# Patient Record
Sex: Male | Born: 1995 | Race: White | Hispanic: No | Smoking: Never smoker
Health system: Southern US, Community
[De-identification: ages and names within clinical notes are randomized; demographics above are authoritative.]

## PROBLEM LIST (undated history)

## (undated) DIAGNOSIS — F419 Anxiety disorder, unspecified: Secondary | ICD-10-CM

## (undated) DIAGNOSIS — F329 Major depressive disorder, single episode, unspecified: Secondary | ICD-10-CM

## (undated) DIAGNOSIS — I10 Essential (primary) hypertension: Secondary | ICD-10-CM

## (undated) DIAGNOSIS — F1010 Alcohol abuse, uncomplicated: Secondary | ICD-10-CM

## (undated) DIAGNOSIS — F32A Depression, unspecified: Secondary | ICD-10-CM

## (undated) HISTORY — PX: WISDOM TOOTH EXTRACTION: SHX21

---
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
## 2014-09-22 ENCOUNTER — Encounter (HOSPITAL_COMMUNITY): Payer: Self-pay | Admitting: Emergency Medicine

## 2014-09-22 ENCOUNTER — Encounter (HOSPITAL_COMMUNITY): Payer: Self-pay | Admitting: *Deleted

## 2014-09-22 ENCOUNTER — Emergency Department (HOSPITAL_COMMUNITY)
Admission: EM | Admit: 2014-09-22 | Discharge: 2014-09-22 | Disposition: A | Discharge status: Other Acute Inpatient Hospital | Payer: Self-pay | Attending: Emergency Medicine | Admitting: Emergency Medicine

## 2014-09-22 ENCOUNTER — Inpatient Hospital Stay (HOSPITAL_COMMUNITY)
Admission: EM | Admit: 2014-09-22 | Discharge: 2014-09-24 | DRG: 885 | Disposition: A | Discharge status: Home / Self Care | Payer: 59 | Source: Intra-hospital | Attending: Psychiatry | Admitting: Psychiatry

## 2014-09-22 DIAGNOSIS — F419 Anxiety disorder, unspecified: Secondary | ICD-10-CM | POA: Diagnosis present

## 2014-09-22 DIAGNOSIS — F332 Major depressive disorder, recurrent severe without psychotic features: Principal | ICD-10-CM | POA: Diagnosis present

## 2014-09-22 DIAGNOSIS — R45851 Suicidal ideations: Secondary | ICD-10-CM | POA: Insufficient documentation

## 2014-09-22 DIAGNOSIS — F329 Major depressive disorder, single episode, unspecified: Secondary | ICD-10-CM | POA: Diagnosis present

## 2014-09-22 DIAGNOSIS — G4700 Insomnia, unspecified: Secondary | ICD-10-CM | POA: Diagnosis present

## 2014-09-22 DIAGNOSIS — R4589 Other symptoms and signs involving emotional state: Secondary | ICD-10-CM

## 2014-09-22 DIAGNOSIS — F4323 Adjustment disorder with mixed anxiety and depressed mood: Secondary | ICD-10-CM | POA: Diagnosis present

## 2014-09-22 DIAGNOSIS — R4689 Other symptoms and signs involving appearance and behavior: Secondary | ICD-10-CM

## 2014-09-22 DIAGNOSIS — Z599 Problem related to housing and economic circumstances, unspecified: Secondary | ICD-10-CM | POA: Diagnosis not present

## 2014-09-22 DIAGNOSIS — F331 Major depressive disorder, recurrent, moderate: Secondary | ICD-10-CM | POA: Insufficient documentation

## 2014-09-22 LAB — COMPREHENSIVE METABOLIC PANEL
ALBUMIN: 4.4 g/dL (ref 3.5–5.2)
ALK PHOS: 82 U/L (ref 39–117)
ALT: 15 U/L (ref 0–53)
AST: 13 U/L (ref 0–37)
Anion gap: 10 (ref 5–15)
BILIRUBIN TOTAL: 0.5 mg/dL (ref 0.3–1.2)
BUN: 9 mg/dL (ref 6–23)
CHLORIDE: 103 meq/L (ref 96–112)
CO2: 28 mEq/L (ref 19–32)
Calcium: 9.4 mg/dL (ref 8.4–10.5)
Creatinine, Ser: 0.86 mg/dL (ref 0.50–1.35)
GLUCOSE: 108 mg/dL — AB (ref 70–99)
POTASSIUM: 3.5 meq/L — AB (ref 3.7–5.3)
SODIUM: 141 meq/L (ref 137–147)
Total Protein: 7.4 g/dL (ref 6.0–8.3)

## 2014-09-22 LAB — RAPID URINE DRUG SCREEN, HOSP PERFORMED
Amphetamines: NOT DETECTED
Barbiturates: NOT DETECTED
Benzodiazepines: NOT DETECTED
Cocaine: NOT DETECTED
Opiates: NOT DETECTED
TETRAHYDROCANNABINOL: POSITIVE — AB

## 2014-09-22 LAB — CBC WITH DIFFERENTIAL/PLATELET
BASOS ABS: 0 10*3/uL (ref 0.0–0.1)
Basophils Relative: 0 % (ref 0–1)
EOS ABS: 0.1 10*3/uL (ref 0.0–0.7)
EOS PCT: 2 % (ref 0–5)
HCT: 40.8 % (ref 39.0–52.0)
HEMOGLOBIN: 14.3 g/dL (ref 13.0–17.0)
LYMPHS PCT: 32 % (ref 12–46)
Lymphs Abs: 2.4 10*3/uL (ref 0.7–4.0)
MCH: 31.2 pg (ref 26.0–34.0)
MCHC: 35 g/dL (ref 30.0–36.0)
MCV: 88.9 fL (ref 78.0–100.0)
Monocytes Absolute: 0.8 10*3/uL (ref 0.1–1.0)
Monocytes Relative: 11 % (ref 3–12)
Neutro Abs: 4 10*3/uL (ref 1.7–7.7)
Neutrophils Relative %: 55 % (ref 43–77)
PLATELETS: 257 10*3/uL (ref 150–400)
RBC: 4.59 MIL/uL (ref 4.22–5.81)
RDW: 12.7 % (ref 11.5–15.5)
WBC: 7.4 10*3/uL (ref 4.0–10.5)

## 2014-09-22 LAB — ETHANOL

## 2014-09-22 MED ORDER — ZOLPIDEM TARTRATE 5 MG PO TABS: 5.0000 mg | ORAL_TABLET | Freq: Every evening | ORAL | Status: DC | PRN

## 2014-09-22 MED ORDER — NICOTINE 21 MG/24HR TD PT24
21.0000 mg | MEDICATED_PATCH | Freq: Every day | TRANSDERMAL | Status: DC
Start: 2014-09-22 — End: 2014-09-22

## 2014-09-22 MED ORDER — LORAZEPAM 1 MG PO TABS: 1.0000 mg | ORAL_TABLET | Freq: Three times a day (TID) | ORAL | Status: DC | PRN

## 2014-09-22 MED ORDER — ONDANSETRON HCL 4 MG PO TABS: 4.0000 mg | ORAL_TABLET | Freq: Three times a day (TID) | ORAL | Status: DC | PRN

## 2014-09-22 MED ORDER — ALUM & MAG HYDROXIDE-SIMETH 200-200-20 MG/5ML PO SUSP: 30.0000 mL | ORAL | Status: DC | PRN

## 2014-09-22 MED ORDER — ACETAMINOPHEN 325 MG PO TABS: 650.0000 mg | ORAL_TABLET | Freq: Four times a day (QID) | ORAL | Status: DC | PRN

## 2014-09-22 MED ORDER — MAGNESIUM HYDROXIDE 400 MG/5ML PO SUSP: 30.0000 mL | Freq: Every day | ORAL | Status: DC | PRN

## 2014-09-22 MED ORDER — ACETAMINOPHEN 325 MG PO TABS: 650.0000 mg | ORAL_TABLET | ORAL | Status: DC | PRN

## 2014-09-22 MED ORDER — HYDROXYZINE HCL 25 MG PO TABS: 25.0000 mg | ORAL_TABLET | Freq: Two times a day (BID) | ORAL | Status: DC | PRN

## 2014-09-22 MED ORDER — TRAZODONE HCL 50 MG PO TABS
50.0000 mg | ORAL_TABLET | Freq: Every evening | ORAL | Status: DC | PRN
  Filled 2014-09-22 (×7): qty 1

## 2014-09-22 NOTE — ED Notes (Signed)
Physical assessment of pt, shows no skin breakdown or wounds to body.  [facility] to leave pt in our care with sitter at bedside.

## 2014-09-22 NOTE — ED Provider Notes (Signed)
CSN: [id number]     Arrival date & time 09/22/14  0110 History   First MD Initiated Contact with Patient 09/22/14 0236     Chief Complaint  Patient presents with  . V70.1     (Consider location/radiation/quality/duration/timing/severity/associated sxs/prior Treatment) The history is provided by the patient.  18 year old male was brought here because his family took out involuntary commitment papers on him. Family states that he has been acting strange for the last several months. He had been threatening deeply he was living with. He has placed posts on [employer] stating that he has tried to kill himself several times and "has had close attempts multiple times". He has told family members "you should see what I do to myself". Family member who is here with him states that he has told other people that he has been depressed and thought of killing himself. When I speak with him, patient denies depression and denies homicidal or suicidal thoughts. He denies crying spells, sleep disturbance, anhedonia. He had a post on Facebook stating his time was up and when asked about that he meant that he was going to sleep and that he was going off of Facebook. He denies alcohol or drug use. He denies hallucinations. He denies prior psychiatric diagnosis or treatment.  History reviewed. No pertinent past medical history. History reviewed. No pertinent past surgical history. History reviewed. No pertinent family history. History  Substance Use Topics  . Smoking status: Never Smoker   . Smokeless tobacco: Never Used  . Alcohol Use: No    Review of Systems  All other systems reviewed and are negative.     Allergies  Review of patient's allergies indicates not on file.  Home Medications   Prior to Admission medications   Not on File   BP 145/80 mmHg  Pulse 101  Temp(Src) 99.3 F (37.4 C) (Oral)  Resp 18  Ht 6' (1.829 m)  Wt 153 lb (69.4 kg)  BMI 20.75 kg/m2  SpO2 100% Physical Exam  Nursing  note and vitals reviewed.  18 year old male, resting comfortably and in no acute distress. Vital signs are significant for borderline tachycardia and mild hypertension. Oxygen saturation is 100%, which is normal. Head is normocephalic and atraumatic. PERRLA, EOMI. Oropharynx is clear. Neck is nontender and supple without adenopathy or JVD. Back is nontender and there is no CVA tenderness. Lungs are clear without rales, wheezes, or rhonchi. Chest is nontender. Heart has regular rate and rhythm without murmur. Abdomen is soft, flat, nontender without masses or hepatosplenomegaly and peristalsis is normoactive. Extremities have no cyanosis or edema, full range of motion is present. Skin is warm and dry without rash. No signs of trauma. Neurologic: Mental status is normal, cranial nerves are intact, there are no motor or sensory deficits. Psychiatric: Slightly depressed affect. He tends to speak in a monotone and does not make eye contact.  ED Course  Procedures (including critical care time) Labs Review Results for orders placed or performed during the hospital encounter of 09/22/14  CBC WITH DIFFERENTIAL  Result Value Ref Range   WBC 7.4 4.0 - 10.5 K/uL   RBC 4.59 4.22 - 5.81 MIL/uL   Hemoglobin 14.3 13.0 - 17.0 g/dL   HCT 40.940.8 81.139.0 - 91.452.0 %   MCV 88.9 78.0 - 100.0 fL   MCH 31.2 26.0 - 34.0 pg   MCHC 35.0 30.0 - 36.0 g/dL   RDW 78.212.7 95.611.5 - 21.315.5 %   Platelets 257 150 - 400 K/uL  Neutrophils Relative % 55 43 - 77 %   Neutro Abs 4.0 1.7 - 7.7 K/uL   Lymphocytes Relative 32 12 - 46 %   Lymphs Abs 2.4 0.7 - 4.0 K/uL   Monocytes Relative 11 3 - 12 %   Monocytes Absolute 0.8 0.1 - 1.0 K/uL   Eosinophils Relative 2 0 - 5 %   Eosinophils Absolute 0.1 0.0 - 0.7 K/uL   Basophils Relative 0 0 - 1 %   Basophils Absolute 0.0 0.0 - 0.1 K/uL  Comprehensive metabolic panel  Result Value Ref Range   Sodium 141 137 - 147 mEq/L   Potassium 3.5 (L) 3.7 - 5.3 mEq/L   Chloride 103 96 - 112 mEq/L    CO2 28 19 - 32 mEq/L   Glucose, Bld 108 (H) 70 - 99 mg/dL   BUN 9 6 - 23 mg/dL   Creatinine, Ser 2.950.86 0.50 - 1.35 mg/dL   Calcium 9.4 8.4 - 28.410.5 mg/dL   Total Protein 7.4 6.0 - 8.3 g/dL   Albumin 4.4 3.5 - 5.2 g/dL   AST 13 0 - 37 U/L   ALT 15 0 - 53 U/L   Alkaline Phosphatase 82 39 - 117 U/L   Total Bilirubin 0.5 0.3 - 1.2 mg/dL   GFR calc non Af Amer >90 >90 mL/min   GFR calc Af Amer >90 >90 mL/min   Anion gap 10 5 - 15  Drug screen panel, emergency  Result Value Ref Range   Opiates NONE DETECTED NONE DETECTED   Cocaine NONE DETECTED NONE DETECTED   Benzodiazepines NONE DETECTED NONE DETECTED   Amphetamines NONE DETECTED NONE DETECTED   Tetrahydrocannabinol POSITIVE (A) NONE DETECTED   Barbiturates NONE DETECTED NONE DETECTED  Ethanol  Result Value Ref Range   Alcohol, Ethyl (B) <11 0 - 11 mg/dL   MDM   Final diagnoses:  Suicidal behavior    Possible depression with suicidal thoughts. Patient is denying all of this to me but he certainly has a depressed affect. He also is quick to explain away things such as his expiration for his Facebook post about his time is up. He is strict screen is positive for tetrahydrocannabinol and he says that he was in the bathroom at the park and people were smoking marijuana there and his marijuana would be secondhand. Consultation will be obtained with TTS.  Psychiatry has evaluated the patient 10 feel that he thinks he does have suicidal ideation and [facility].  Dione Boozeavid Aleanna Menge, MD 09/23/14 [phone]

## 2014-09-22 NOTE — ED Notes (Signed)
Per IVC paperwork, pt's father states pt having been acting like himself for pass month, and has posted on Facebook about taking life tonight.  Per pt, that's what his family says, he's not saying anything.

## 2014-09-22 NOTE — ED Notes (Signed)
Pt had tele/assessment and tele/psych this am, physician will contact sister Lawerance BachSabrina Reser, for further questions regarding pt's evaluation.  Contact Sister Lawerance Bach(Sabrina Axe) [phone]

## 2014-09-22 NOTE — Consult Note (Signed)
Telepsych Consultation   Reason for Consult:  Patient disposition Referring Physician:  Roxanne Mins MD Keith Parks is an 18 y.o. male.  Assessment: AXIS I:  Major Depression, Recurrent severe AXIS II:  No diagnosis AXIS III:  History reviewed. No pertinent past medical history. AXIS IV:  problems with primary support group AXIS V:  11-20 some danger of hurting self or others possible OR occasionally fails to maintain minimal personal hygiene OR gross impairment in communication  Plan:  Disposition will be depending further subjective findings and collaboration of IVC information with sister Keith Parks  Subjective:   Keith Parks is a 18 y.o. male patient presenting to the [facility] under IVC Papers filed by his sister Keith Parks. The sister is unavailable at time of patient interview. I attempted to call the patient for collateral information at 05:42 hours without success. Reportedly the patient has been threatening family members, acting strange for several months and has posted statements on Face book that is concerning for suicidality. Keith Parks has reportedly told his peers and friends he's depressed and is having thoughts of killing himself. The patient when asked to rate his depressive sx stated it is a 2/10. The patient is denying any insomnia, change in appetite, weight loss, change in hygiene habits, anhedonia, crying spells, racing thoughts or mood swings. The patient is denying nay mental health history, use of MH out patient services, or pending legal concenrs. The patient is denying use of tobacco, alcohol and illicit drugs albeit his UDS is positive for THC. The patient is a HS grad, currently unemployed and without stated hobbies or outside interest. The patient has a benign PMHX and isnt under the care of a PCP or Psychiatrist/Therapist. The patient patient is denying nay hx of PTSD, sexual and or physical abuses.   HPI Elements: Location: MDD without psychosis Quality:  Acute Severity: Severe Timing: Unknown Duration: Unknown Context: making of Suicidal comments and posting on FB page        Past Psychiatric History: History reviewed. No pertinent past medical history.  reports that he has never smoked. He has never used smokeless tobacco. He reports that he does not drink alcohol or use illicit drugs. History reviewed. No pertinent family history.       Allergies:  Not on File  ACT Assessment Complete:  No:   Past Psychiatric History: Diagnosis:  MDD with reported SI  Hospitalizations:  no  Outpatient Care:  no  Substance Abuse Care:  no  Self-Mutilation:  no  Suicidal Attempts:  unknown  Homicidal Behaviors:  no   Violent Behaviors:  no   Place of Residence:  [place] Marital Status:  single Employed/Unemployed:  unemployed Education:  HS grad Family Supports:  yes Objective: Blood pressure 100/52, pulse 59, temperature 97.8 F (36.6 C), temperature source Oral, resp. rate 18, height 6' (1.829 m), weight 69.4 kg (153 lb), SpO2 98 %.Body mass index is 20.75 kg/(m^2). Results for orders placed or performed during the hospital encounter of 09/22/14 (from the past 72 hour(s))  CBC WITH DIFFERENTIAL     Status: None   Collection Time: 09/22/14  1:52 AM  Result Value Ref Range   WBC 7.4 4.0 - 10.5 K/uL   RBC 4.59 4.22 - 5.81 MIL/uL   Hemoglobin 14.3 13.0 - 17.0 g/dL   HCT 40.8 39.0 - 52.0 %   MCV 88.9 78.0 - 100.0 fL   MCH 31.2 26.0 - 34.0 pg   MCHC 35.0 30.0 - 36.0 g/dL  RDW 12.7 11.5 - 15.5 %   Platelets 257 150 - 400 K/uL   Neutrophils Relative % 55 43 - 77 %   Neutro Abs 4.0 1.7 - 7.7 K/uL   Lymphocytes Relative 32 12 - 46 %   Lymphs Abs 2.4 0.7 - 4.0 K/uL   Monocytes Relative 11 3 - 12 %   Monocytes Absolute 0.8 0.1 - 1.0 K/uL   Eosinophils Relative 2 0 - 5 %   Eosinophils Absolute 0.1 0.0 - 0.7 K/uL   Basophils Relative 0 0 - 1 %   Basophils Absolute 0.0 0.0 - 0.1 K/uL  Comprehensive metabolic panel     Status:  Abnormal   Collection Time: 09/22/14  1:52 AM  Result Value Ref Range   Sodium 141 137 - 147 mEq/L   Potassium 3.5 (L) 3.7 - 5.3 mEq/L   Chloride 103 96 - 112 mEq/L   CO2 28 19 - 32 mEq/L   Glucose, Bld 108 (H) 70 - 99 mg/dL   BUN 9 6 - 23 mg/dL   Creatinine, Ser 0.86 0.50 - 1.35 mg/dL   Calcium 9.4 8.4 - 10.5 mg/dL   Total Protein 7.4 6.0 - 8.3 g/dL   Albumin 4.4 3.5 - 5.2 g/dL   AST 13 0 - 37 U/L   ALT 15 0 - 53 U/L   Alkaline Phosphatase 82 39 - 117 U/L   Total Bilirubin 0.5 0.3 - 1.2 mg/dL   GFR calc non Af Amer >90 >90 mL/min   GFR calc Af Amer >90 >90 mL/min    Comment: (NOTE) The eGFR has been calculated using the CKD EPI equation. This calculation has not been validated in all clinical situations. eGFR's persistently <90 mL/min signify possible Chronic Kidney Disease.    Anion gap 10 5 - 15  Ethanol     Status: None   Collection Time: 09/22/14  1:52 AM  Result Value Ref Range   Alcohol, Ethyl (B) <11 0 - 11 mg/dL    Comment:        LOWEST DETECTABLE LIMIT FOR SERUM ALCOHOL IS 11 mg/dL FOR MEDICAL PURPOSES ONLY   Drug screen panel, emergency     Status: Abnormal   Collection Time: 09/22/14  1:55 AM  Result Value Ref Range   Opiates NONE DETECTED NONE DETECTED   Cocaine NONE DETECTED NONE DETECTED   Benzodiazepines NONE DETECTED NONE DETECTED   Amphetamines NONE DETECTED NONE DETECTED   Tetrahydrocannabinol POSITIVE (A) NONE DETECTED   Barbiturates NONE DETECTED NONE DETECTED    Comment:        DRUG SCREEN FOR MEDICAL PURPOSES ONLY.  IF CONFIRMATION IS NEEDED FOR ANY PURPOSE, NOTIFY LAB WITHIN 5 DAYS.        LOWEST DETECTABLE LIMITS FOR URINE DRUG SCREEN Drug Class       Cutoff (ng/mL) Amphetamine      1000 Barbiturate      200 Benzodiazepine   947 Tricyclics       654 Opiates          300 Cocaine          300 THC              50    Labs are reviewed and are pertinent for positive THC per UDS  No current facility-administered medications for this  encounter.   No current outpatient prescriptions on file.    Psychiatric Specialty Exam:     Blood pressure 100/52, pulse 59, temperature 97.8 F (36.6 C),  temperature source Oral, resp. rate 18, height 6' (1.829 m), weight 69.4 kg (153 lb), SpO2 98 %.Body mass index is 20.75 kg/(m^2).  General Appearance: Casual  Eye Contact::  Minimal  Speech:  Slow  Volume:  Decreased  Mood:  Depressed  Affect:  Congruent  Thought Process:  Negative  Orientation:  Full (Time, Place, and Person)  Thought Content:  Negative  Suicidal Thoughts:  Yes.  with intent/plan  Homicidal Thoughts:  No  Memory:  Immediate;   Poor  Judgement:  NA  Insight:  Lacking  Psychomotor Activity:  Negative  Concentration:  Poor  Recall:  Poor  Akathisia:  Negative  Handed:  Right  AIMS (if indicated):     Assets:  Social support  Sleep:      Treatment Plan Summary: Possible IP placement for crises mgmt, safety and or stabilization, after the IVC filent Keith Parks can be interviewed to collaborate what has been reported.  Disposition:    Keith Parks,Keith Parks 09/22/2014 5:49 AM I agree with assessment and plan Geralyn Flash A. Sabra Heck, M.D.

## 2014-09-22 NOTE — ED Provider Notes (Signed)
18 y.o. Male presents to ed with reports of suicidal ideation specifically with report that he has been posting on Facebook about taking his own life.  Psychiatry has been consulted and interviewed patient and family.  IVC papers signed by me.  Psychiatry plans admission but is looking for placement.    Hilario Quarryanielle S Quinetta Shilling, MD 09/22/14 [id number]

## 2014-09-22 NOTE — Consult Note (Signed)
Telepsych Consultation   Reason for Consult:  Suicidal Ideation per IVC papers Referring Physician:  EDP Keith Parks is an 18 y.o. male.  Assessment: AXIS I:  Major Depression, Recurrent severe AXIS II:  Deferred AXIS III:  History reviewed. No pertinent past medical history. AXIS IV:  problems with primary support group AXIS V:  11-20 some danger of hurting self or others possible OR occasionally fails to maintain minimal personal hygiene OR gross impairment in communication  Plan:  Admit to inpatient for stabilization of suicidal ideation and MDD  Subjective:   Keith Parks is a 18 y.o. male patient presenting to the [place] under IVC Papers filed by his sister Bryten Maher. Pt currently minimizes any suicidal ideation or thoughts of depression although presents with flat affect. Pt also denies HI, and AVH. Pt denies any recent triggering events and reports that this is a "misunderstanding from a facebook post about nearing his end of time," which he states was a reference to being near his bedtime. Pt denies ETOH and illicit drug use, aside from a "contact high for weed". Denies any present or past psych history. However, this NP was able to reach the pt's sister, Gabriel Cirri, and father, Shanon Brow for collateral information. The pt's sister states that pt has made multiple Facebook posts about failed suicide attempts (at least 3 per her report) and that he will "have another chance to do it right this time". The sister was extremely concerned about the pt, stating that he has been very depressed and isolating himself x 3 months and that he often will sleep in his car or not return for many days at a time, which no calls, refusal to answer his phone, and no information given to family about his whereabouts. She also states that she read his facebook post about "being near the end of his time" and was very concerned given his previous posts, which she states she took screen shots of, printed and  presented to the magistrate to take out the IVC paperwork. When speaking to his father, Shanon Brow, he reports that multiple family members have called him, including a cousin close to him, stating that "Legrand Como is talking about wanting to hurt himself and says he has cut himself a in the past but it didn't work". His father reports that he doesn't share detailed information with him, but that when the pt is home, he has to "force him to eat because he just won't eat anything at all, like he's starving himself so we have to literally make him eat". Pt's father is very concerned for his safety as well.    HPI Elements: Location: MDD without psychosis Quality: Acute Severity: Severe Timing: Unknown Duration: Unknown Context: making of Suicidal comments and posting on FB page      Past Psychiatric History: History reviewed. No pertinent past medical history.  reports that he has never smoked. He has never used smokeless tobacco. He reports that he does not drink alcohol or use illicit drugs. History reviewed. No pertinent family history.       Allergies:  Not on File  ACT Assessment Complete:  No:   Past Psychiatric History: Diagnosis:  MDD with reported SI  Hospitalizations:  no  Outpatient Care:  no  Substance Abuse Care:  no  Self-Mutilation:  no  Suicidal Attempts:  Unknown, although collateral affirms 3x potentially  Homicidal Behaviors:  no   Violent Behaviors:  no   Place of Residence:  [place] Marital Status:  single Employed/Unemployed:  unemployed Education:  HS grad Family Supports:  yes Objective: Blood pressure 100/52, pulse 59, temperature 97.8 F (36.6 C), temperature source Oral, resp. rate 18, height 6' (1.829 m), weight 69.4 kg (153 lb), SpO2 98 %.Body mass index is 20.75 kg/(m^2). Results for orders placed or performed during the hospital encounter of 09/22/14 (from the past 72 hour(s))  CBC WITH DIFFERENTIAL     Status: None   Collection Time: 09/22/14  1:52 AM   Result Value Ref Range   WBC 7.4 4.0 - 10.5 K/uL   RBC 4.59 4.22 - 5.81 MIL/uL   Hemoglobin 14.3 13.0 - 17.0 g/dL   HCT 40.8 39.0 - 52.0 %   MCV 88.9 78.0 - 100.0 fL   MCH 31.2 26.0 - 34.0 pg   MCHC 35.0 30.0 - 36.0 g/dL   RDW 12.7 11.5 - 15.5 %   Platelets 257 150 - 400 K/uL   Neutrophils Relative % 55 43 - 77 %   Neutro Abs 4.0 1.7 - 7.7 K/uL   Lymphocytes Relative 32 12 - 46 %   Lymphs Abs 2.4 0.7 - 4.0 K/uL   Monocytes Relative 11 3 - 12 %   Monocytes Absolute 0.8 0.1 - 1.0 K/uL   Eosinophils Relative 2 0 - 5 %   Eosinophils Absolute 0.1 0.0 - 0.7 K/uL   Basophils Relative 0 0 - 1 %   Basophils Absolute 0.0 0.0 - 0.1 K/uL  Comprehensive metabolic panel     Status: Abnormal   Collection Time: 09/22/14  1:52 AM  Result Value Ref Range   Sodium 141 137 - 147 mEq/L   Potassium 3.5 (L) 3.7 - 5.3 mEq/L   Chloride 103 96 - 112 mEq/L   CO2 28 19 - 32 mEq/L   Glucose, Bld 108 (H) 70 - 99 mg/dL   BUN 9 6 - 23 mg/dL   Creatinine, Ser 0.86 0.50 - 1.35 mg/dL   Calcium 9.4 8.4 - 10.5 mg/dL   Total Protein 7.4 6.0 - 8.3 g/dL   Albumin 4.4 3.5 - 5.2 g/dL   AST 13 0 - 37 U/L   ALT 15 0 - 53 U/L   Alkaline Phosphatase 82 39 - 117 U/L   Total Bilirubin 0.5 0.3 - 1.2 mg/dL   GFR calc non Af Amer >90 >90 mL/min   GFR calc Af Amer >90 >90 mL/min    Comment: (NOTE) The eGFR has been calculated using the CKD EPI equation. This calculation has not been validated in all clinical situations. eGFR's persistently <90 mL/min signify possible Chronic Kidney Disease.    Anion gap 10 5 - 15  Ethanol     Status: None   Collection Time: 09/22/14  1:52 AM  Result Value Ref Range   Alcohol, Ethyl (B) <11 0 - 11 mg/dL    Comment:        LOWEST DETECTABLE LIMIT FOR SERUM ALCOHOL IS 11 mg/dL FOR MEDICAL PURPOSES ONLY   Drug screen panel, emergency     Status: Abnormal   Collection Time: 09/22/14  1:55 AM  Result Value Ref Range   Opiates NONE DETECTED NONE DETECTED   Cocaine NONE DETECTED  NONE DETECTED   Benzodiazepines NONE DETECTED NONE DETECTED   Amphetamines NONE DETECTED NONE DETECTED   Tetrahydrocannabinol POSITIVE (A) NONE DETECTED   Barbiturates NONE DETECTED NONE DETECTED    Comment:        DRUG SCREEN FOR MEDICAL PURPOSES ONLY.  IF CONFIRMATION IS NEEDED FOR  ANY PURPOSE, NOTIFY LAB WITHIN 5 DAYS.        LOWEST DETECTABLE LIMITS FOR URINE DRUG SCREEN Drug Class       Cutoff (ng/mL) Amphetamine      1000 Barbiturate      200 Benzodiazepine   017 Tricyclics       209 Opiates          300 Cocaine          300 THC              50    Labs are reviewed and are pertinent for positive THC per UDS  No current facility-administered medications for this encounter.   No current outpatient prescriptions on file.    Psychiatric Specialty Exam:     Blood pressure 100/52, pulse 59, temperature 97.8 F (36.6 C), temperature source Oral, resp. rate 18, height 6' (1.829 m), weight 69.4 kg (153 lb), SpO2 98 %.Body mass index is 20.75 kg/(m^2).  General Appearance: Casual  Eye Contact::  Minimal  Speech:  Slow  Volume:  Decreased  Mood:  Depressed  Affect:  Congruent  Thought Process:  Negative  Orientation:  Full (Time, Place, and Person)  Thought Content:  Negative  Suicidal Thoughts:  Yes.  with intent/plan although minimizing  Homicidal Thoughts:  No  Memory:  Immediate;   Poor  Judgement:  NA  Insight:  Lacking  Psychomotor Activity:  Decreased  Concentration:  Poor  Recall:  Poor  Akathisia:  Negative  Handed:  Right  AIMS (if indicated):     Assets:  Social support  Sleep:      Treatment Plan Summary: -Inpatient psychiatric hospitalization for stabilization of suicidal ideation and MDD; [facility] to [facility] if bed available. If not, refer externally.   Disposition: Admit to inpatient.     *Case reviewed with Dr. Horald Pollen, Elyse Jarvis, FNP-BC 09/22/2014 8:55 AM

## 2014-09-22 NOTE — Progress Notes (Signed)
Patient ID: Keith Parks, male   DOB: 01/29/1996, 18 y.o.   MRN: [id number] Client reports he is here because "my sister went to the judge and told a lie, it was all a misunderstanding. My sister read something wrong on Facebook, "I said my time today is ended" Client denies that this was a suicidal ideation.  "my sister also told them something about me threatening people in [place] and that's not true either" Client denies AVH. 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 Client is visibly anxious during the admission, slow to respond to questions. This is his first inpatient treatment, he had no significant medical history.  Client signed treatment agreement. Staff offered food/drink, oriented to unit/room. Staff to monitor q8515min for safety. Client is safe on unit.

## 2014-09-22 NOTE — Tx Team (Signed)
Initial Interdisciplinary Treatment Plan   PATIENT STRESSORS: Educational concerns Marital or family conflict   PROBLEM LIST: Problem List/Patient Goals Date to be addressed Date deferred Reason deferred Estimated date of resolution  Posted on face book "my time for today is ended" "my sister misunderstood it and had me sent here" 09-22-14                                                      DISCHARGE CRITERIA:  Improved stabilization in mood, thinking, and/or behavior Reduction of life-threatening or endangering symptoms to within safe limits Verbal commitment to aftercare and medication compliance  PRELIMINARY DISCHARGE PLAN: Participate in family therapy Return to previous living arrangement  PATIENT/FAMIILY INVOLVEMENT: This treatment plan has been presented to and reviewed with the patient, Keith Parks, and/or family member.  The patient and family have been given the opportunity to ask questions and make suggestions.  [name], Jhana Giarratano N 09/22/2014, 10:52 PM

## 2014-09-22 NOTE — BH Assessment (Signed)
Calls placed to inform patient's attending, now Dr Aileen PilotZammitt, at 4:14PM and nurse, Delila PereyraGinger Pruitt, RN at 4:19 [facility] Bed 2. RN will ensure [facility] before transfer.  Carney Bernatherine C Harrill, LCSW

## 2014-09-23 ENCOUNTER — Encounter (HOSPITAL_COMMUNITY): Payer: Self-pay | Admitting: Psychiatry

## 2014-09-23 DIAGNOSIS — F4325 Adjustment disorder with mixed disturbance of emotions and conduct: Secondary | ICD-10-CM

## 2014-09-23 MED ORDER — NICOTINE POLACRILEX 2 MG MT GUM
2.0000 mg | CHEWING_GUM | OROMUCOSAL | Status: DC | PRN
  Filled 2014-09-23: qty 1

## 2014-09-23 MED ORDER — NICOTINE POLACRILEX 2 MG MT GUM
CHEWING_GUM | OROMUCOSAL | Status: AC
  Filled 2014-09-23: qty 1

## 2014-09-23 NOTE — BHH Counselor (Signed)
Adult Comprehensive Assessment  Patient ID: Keith Parks, male   DOB: 1996/09/07, 18 y.o.   MRN: [id number]  Information Source: Information source: Patient  Current Stressors:  Educational / Learning stressors: None Employment / Job issues: Patient has been unemployed since July 2015 Family Relationships: Patient denies Surveyor, quantityinancial / Lack of resources (include bankruptcy): Makes it okay Housing / Lack of housing: None Physical health (include injuries & life threatening diseases): None Social relationships: None Substance abuse: NOne Bereavement / Loss: None  Living/Environment/Situation:  Living Arrangements: Parent Living conditions (as described by patient or guardian): Good How long has patient lived in current situation?: All of his life What is atmosphere in current home: Comfortable, Supportive  Family History:  Marital status: Single Does patient have children?: No  Childhood History:  By whom was/is the patient raised?: Both parents Additional childhood history information: Good childhood Description of patient's relationship with caregiver when they were a child: Okay Patient's description of current relationship with people who raised him/her: Alright Does patient have siblings?: Yes Number of Siblings: 3 Description of patient's current relationship with siblings: Patient reports an okay relationship with sisters Did patient suffer any verbal/emotional/physical/sexual abuse as a child?: No Did patient suffer from severe childhood neglect?: No Has patient ever been sexually abused/assaulted/raped as an adolescent or adult?: No Was the patient ever a victim of a crime or a disaster?: No Witnessed domestic violence?: No Has patient been effected by domestic violence as an adult?: No  Education:  Highest grade of school patient has completed: Pro[employer], television/film/videoHigh School Currently a student?: No Learning disability?: No  Employment/Work Situation:   Employment situation:  Unemployed Patient's job has been impacted by current illness: No What is the longest time patient has a held a job?: One year 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 Has patient ever been in the [employer] and Companymilitary?: No Has patient ever served in Buyer, retailcombat?: No  Financial Resources:   Surveyor, quantityinancial resources: No income Does patient have a Lawyerrepresentative payee or guardian?: No  Alcohol/Substance Abuse:   What has been your use of drugs/alcohol within the last 12 months?: Patient denies If attempted suicide, did drugs/alcohol play a role in this?: No Alcohol/Substance Abuse Treatment Hx: Denies past history Has alcohol/substance abuse ever caused legal problems?: No  Social Support System:   Forensic psychologistatient's Community Support System: None Describe Community Support System: N/A Type of faith/religion: None How does patient's faith help to cope with current illness?: N/A  Leisure/Recreation:   Leisure and Hobbies: Skateboarding and basketball  Strengths/Needs:   What things does the patient do well?: Math In what areas does patient struggle / problems for patient: Patient report he has no struggles/problems in life  Discharge Plan:   Does patient have access to transportation?: Yes (Patient advised of being transported by sheriff and expect them to take him home.) Will patient be returning to same living situation after discharge?: Yes Currently receiving community mental health services: No (Patient refusing follow up.) If no, would patient like referral for services when discharged?: No Does patient have financial barriers related to discharge medications?: No  Summary/Recommendations:  Keith Parks is a 18 years old Caucasian male admitted with Major Depression Disorder.  He will benefit from crisis stabilization, evaluation for medication, psycho-education groups for coping skills development, group therapy and case management for discharge planning. ]    Diannah Rindfleisch, Joesph JulyQuylle Hairston.  09/23/2014

## 2014-09-23 NOTE — H&P (Signed)
Psychiatric Admission Assessment Adult  Patient Identification:  Printice A Champeau Date of Evaluation:  09/23/2014 Chief Complaint:  MDD recurrent severe History of Present Illness:: 18 Y/o male who states that his sister misinterpret something he wrote in the internet. He states he was just saying his time was over meaning he was going to sleep. Has ZZZ in there. States his sister also said he threatened some people in [place]. He will not validate that information Does admit that shortly after getting to [facility] while working at [employer] got mono. The initial assessment at the ED is as follows:Neeko A Lacko is a 18 y.o. male patient presenting to the [facility] under IVC Papers filed by his sister Samari Gorby. Pt currently minimizes any suicidal ideation or thoughts of depression although presents with flat affect. Pt also denies HI, and AVH. Pt denies any recent triggering events and reports that this is a "misunderstanding from a facebook post about nearing his end of time," which he states was a reference to being near his bedtime. Pt denies ETOH and illicit drug use, aside from a "contact high for weed". Denies any present or past psych history. However, this NP was able to reach the pt's sister, Gabriel Cirri, and father, Shanon Brow for collateral information. The pt's sister states that pt has made multiple Facebook posts about failed suicide attempts (at least 3 per her report) and that he will "have another chance to do it right this time". The sister was extremely concerned about the pt, stating that he has been very depressed and isolating himself x 3 months and that he often will sleep in his car or not return for many days at a time, which no calls, refusal to answer his phone, and no information given to family about his whereabouts. She also states that she read his facebook post about "being near the end of his time" and was very concerned given his previous posts, which she states she took screen shots of,  printed and presented to the magistrate to take out the IVC paperwork. When speaking to his father, Shanon Brow, he reports that multiple family members have called him, including a cousin close to him, stating that "Legrand Como is talking about wanting to hurt himself and says he has cut himself a in the past but it didn't work". His father reports that he doesn't share detailed information with him, but that when the pt is home, he has to "force him to eat because he just won't eat anything at all, like he's starving himself so we have to literally make him eat". Pt's father is very concerned for his safety as well.   Associated Signs/Synptoms: Depression Symptoms:  Had been depressed 3 years ago (Hypo) Manic Symptoms:  Denies Anxiety Symptoms:  Denies Psychotic Symptoms:  Denies PTSD Symptoms: Negative Total Time spent with patient: 45 minutes  Psychiatric Specialty Exam: Physical Exam  Review of Systems  Constitutional: Positive for weight loss.  Eyes: Negative.   Respiratory: Negative.   Cardiovascular: Negative.   Genitourinary: Negative.   Musculoskeletal: Positive for neck pain.  Skin: Negative.   Neurological: Negative.   Endo/Heme/Allergies: Negative.   Psychiatric/Behavioral: Positive for depression. The patient is nervous/anxious.     Blood pressure 114/58, pulse 104, temperature 98 F (36.7 C), temperature source Oral, resp. rate 16, height 5' 10.75" (1.797 m), weight 69.4 kg (153 lb), SpO2 99 %.Body mass index is 21.49 kg/(m^2).  General Appearance: Fairly Groomed  Engineer, water::  Fair  Speech:  Clear and Coherent  and not spontaneous  Volume:  Decreased  Mood:  Anxious  Affect:  Restricted  Thought Process:  Coherent and Goal Directed  Orientation:  Full (Time, Place, and Person)  Thought Content:  events his wanting to be D/C denying minimizing  Suicidal Thoughts:  No  Homicidal Thoughts:  No  Memory:  Immediate;   Fair Recent;   Fair Remote;   Fair  Judgement:  Fair   Insight:  Lacking  Psychomotor Activity:  Decreased  Concentration:  Fair  Recall:  [employer] of Knowledge:NA  Language: Fair  Akathisia:  No  Handed:    AIMS (if indicated):     Assets:  Desire for Improvement Social Support  Sleep:       Musculoskeletal: Strength & Muscle Tone: within normal limits Gait & Station: normal Patient leans: N/A  Past Psychiatric History: Diagnosis:  Hospitalizations:Denies  Outpatient Care: Denies   Substance Abuse Care:Denies  Self-Mutilation:Denies  Suicidal Attempts:Denies  Violent Behaviors:Denies   Past Medical History:  History reviewed. No pertinent past medical history. Had Mono week ago  Allergies:  Not on File PTA Medications: Prescriptions prior to admission  Medication Sig Dispense Refill Last Dose  . ibuprofen (ADVIL,MOTRIN) 200 MG tablet Take 200-400 mg by mouth every 6 (six) hours as needed for headache.   Past Week at Unknown time    Previous Psychotropic Medications:  Medication/Dose    None             Substance Abuse History in the last 12 months:  No.  Consequences of Substance Abuse: Negative  Social History:  reports that he has never smoked. He has never used smokeless tobacco. He reports that he does not drink alcohol or use illicit drugs. Additional Social History:                      Current Place of Residence:  States he stays with his father and his sister in [place] of Birth:   Family Members: Marital Status:  Single Children:  Sons:  Daughters: Relationships: Education:  HS Graduate will go to college for business Educational Problems/Performance: Religious Beliefs/Practices: not currently  History of Abuse (Emotional/Phsycial/Sexual)Denies Consulting civil engineer History:  None. Legal History: Denies Hobbies/Interests:  Family History:  History reviewed. No pertinent family history.                              Denies Results for orders placed  or performed during the hospital encounter of 09/22/14 (from the past 72 hour(s))  CBC WITH DIFFERENTIAL     Status: None   Collection Time: 09/22/14  1:52 AM  Result Value Ref Range   WBC 7.4 4.0 - 10.5 K/uL   RBC 4.59 4.22 - 5.81 MIL/uL   Hemoglobin 14.3 13.0 - 17.0 g/dL   HCT 40.8 39.0 - 52.0 %   MCV 88.9 78.0 - 100.0 fL   MCH 31.2 26.0 - 34.0 pg   MCHC 35.0 30.0 - 36.0 g/dL   RDW 12.7 11.5 - 15.5 %   Platelets 257 150 - 400 K/uL   Neutrophils Relative % 55 43 - 77 %   Neutro Abs 4.0 1.7 - 7.7 K/uL   Lymphocytes Relative 32 12 - 46 %   Lymphs Abs 2.4 0.7 - 4.0 K/uL   Monocytes Relative 11 3 - 12 %   Monocytes Absolute 0.8 0.1 - 1.0 K/uL   Eosinophils Relative  2 0 - 5 %   Eosinophils Absolute 0.1 0.0 - 0.7 K/uL   Basophils Relative 0 0 - 1 %   Basophils Absolute 0.0 0.0 - 0.1 K/uL  Comprehensive metabolic panel     Status: Abnormal   Collection Time: 09/22/14  1:52 AM  Result Value Ref Range   Sodium 141 137 - 147 mEq/L   Potassium 3.5 (L) 3.7 - 5.3 mEq/L   Chloride 103 96 - 112 mEq/L   CO2 28 19 - 32 mEq/L   Glucose, Bld 108 (H) 70 - 99 mg/dL   BUN 9 6 - 23 mg/dL   Creatinine, Ser 0.86 0.50 - 1.35 mg/dL   Calcium 9.4 8.4 - 10.5 mg/dL   Total Protein 7.4 6.0 - 8.3 g/dL   Albumin 4.4 3.5 - 5.2 g/dL   AST 13 0 - 37 U/L   ALT 15 0 - 53 U/L   Alkaline Phosphatase 82 39 - 117 U/L   Total Bilirubin 0.5 0.3 - 1.2 mg/dL   GFR calc non Af Amer >90 >90 mL/min   GFR calc Af Amer >90 >90 mL/min    Comment: (NOTE) The eGFR has been calculated using the CKD EPI equation. This calculation has not been validated in all clinical situations. eGFR's persistently <90 mL/min signify possible Chronic Kidney Disease.    Anion gap 10 5 - 15  Ethanol     Status: None   Collection Time: 09/22/14  1:52 AM  Result Value Ref Range   Alcohol, Ethyl (B) <11 0 - 11 mg/dL    Comment:        LOWEST DETECTABLE LIMIT FOR SERUM ALCOHOL IS 11 mg/dL FOR MEDICAL PURPOSES ONLY   Drug screen panel,  emergency     Status: Abnormal   Collection Time: 09/22/14  1:55 AM  Result Value Ref Range   Opiates NONE DETECTED NONE DETECTED   Cocaine NONE DETECTED NONE DETECTED   Benzodiazepines NONE DETECTED NONE DETECTED   Amphetamines NONE DETECTED NONE DETECTED   Tetrahydrocannabinol POSITIVE (A) NONE DETECTED   Barbiturates NONE DETECTED NONE DETECTED    Comment:        DRUG SCREEN FOR MEDICAL PURPOSES ONLY.  IF CONFIRMATION IS NEEDED FOR ANY PURPOSE, NOTIFY LAB WITHIN 5 DAYS.        LOWEST DETECTABLE LIMITS FOR URINE DRUG SCREEN Drug Class       Cutoff (ng/mL) Amphetamine      1000 Barbiturate      200 Benzodiazepine   521 Tricyclics       747 Opiates          300 Cocaine          300 THC              50    Psychological Evaluations:  Assessment:   DSM5:  Depressive Disorders:  Major Depressive Disorder - Moderate (296.22)  AXIS I:  Adjustment Disorder with Mixed Emotional Features AXIS II:  No diagnosis AXIS III:  History reviewed. No pertinent past medical history. AXIS IV:  other psychosocial or environmental problems AXIS V:  51-60 moderate symptoms  Treatment Plan/Recommendations:  Supportive approach/coping skills  Decrease resistance                                                                 Get collateral information  Treatment Plan Summary: Daily contact with patient to assess and evaluate symptoms and progress in treatment Current Medications:  Current Facility-Administered Medications  Medication Dose Route Frequency Provider Last Rate Last Dose  . acetaminophen (TYLENOL) tablet 650 mg  650 mg Oral Q6H PRN Evanna Glenda Chroman, NP      . alum & mag hydroxide-simeth (MAALOX/MYLANTA) 200-200-20 MG/5ML suspension 30 mL  30 mL Oral Q4H PRN Evanna Glenda Chroman, NP      . hydrOXYzine (ATARAX/VISTARIL) tablet 25 mg  25 mg Oral BID PRN Evanna Glenda Chroman, NP      . magnesium hydroxide (MILK OF  MAGNESIA) suspension 30 mL  30 mL Oral Daily PRN Evanna Glenda Chroman, NP      . traZODone (DESYREL) tablet 50 mg  50 mg Oral QHS,MR X 1 Evanna Cori Greig Castilla, NP   50 mg at 09/22/14 2300    Observation Level/Precautions:  15 minute checks  Laboratory:  As per the ED  Psychotherapy:  Individual/group  Medications:    Consultations:    Discharge Concerns:  Safety  Estimated LOS: 3-5  Other:     I certify that inpatient services furnished can reasonably be expected to improve the patient's condition.   [place] A 11/6/20159:03 AM

## 2014-09-23 NOTE — Plan of Care (Signed)
Problem: Ineffective individual coping Goal: STG-Increase in ability to manage activities of daily living Outcome: Progressing Pt able to take a shower today

## 2014-09-23 NOTE — Plan of Care (Signed)
Problem: Ineffective individual coping Goal: STG: Patient will remain free from self harm Outcome: Progressing     
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
## 2014-09-23 NOTE — Tx Team (Signed)
Interdisciplinary Treatment Plan Update   Date Reviewed:  09/23/2014  Time Reviewed:  8:35 AM  Progress in Treatment:   Attending groups: Yes Participating in groups: Yes Taking medication as prescribed: Yes  Tolerating medication: Yes Family/Significant other contact made:  No, but will ask patient for consent for collateral contact Patient understands diagnosis: Yes  Discussing patient identified problems/goals with staff: Yes Medical problems stabilized or resolved: Yes Denies suicidal/homicidal ideation: Yes Patient has not harmed self or others: Yes  For review of initial/current patient goals, please see plan of care.  Estimated Length of Stay:  3-5 days  Reasons for Continued Hospitalization:  Anxiety Depression Medication stabilization  New Problems/Goals identified:    Discharge Plan or Barriers:   Home with outpatient follow up to be determined  Additional Comments:  Keith Parks is a 18 y.o. male patient presenting to the [facility] under IVC Papers filed by his sister Lawerance BachSabrina Bowden. Pt currently minimizes any suicidal ideation or thoughts of depression although presents with flat affect. Pt also denies HI, and AVH. Pt denies any recent triggering events and reports that this is a "misunderstanding from a facebook post about nearing his end of time," which he states was a reference to being near his bedtime. Pt denies ETOH and illicit drug use, aside from a "contact high for weed". Denies any present or past psych history. However, this NP was able to reach the pt's sister, Martie LeeSabrina, and father, Onalee HuaDavid for collateral information. The pt's sister states that pt has made multiple Facebook posts about failed suicide attempts (at least 3 per her report) and that he will "have another chance to do it right this time". The sister was extremely concerned about the pt, stating that he has been very depressed and isolating himself x 3 months and that he often will sleep in his car or not  return for many days at a time, which no calls, refusal to answer his phone, and no information given to family about his whereabouts. She also states that she read his facebook post about "being near the end of his time" and was very concerned given his previous posts, which she states she took screen shots of, printed and presented to the magistrate to take out the IVC paperwork.   Patient and CSW will meet to review/ goals and treatment plan.    Attendees:  Patient:  09/23/2014 8:35 AM   Signature:  Sallyanne HaversF. Cobos, MD 09/23/2014 8:35 AM  Signature: Geoffery LyonsIrving Lugo, MD 09/23/2014 8:35 AM  Signature: Robbie LouisVivian Kent, RN 09/23/2014 8:35 AM  Signature: Quintella ReichertBeverly Knight, RN 09/23/2014 8:35 AM  Signature:  Earl ManySara Twyman, RN 09/23/2014 8:35 AM  Signature:  Juline PatchQuylle Mikki Ziff, LCSW 09/23/2014 8:35 AM  Signature:  Belenda CruiseKristin Drinkard, LCSW-A 09/23/2014 8:35 AM  Signature:  Leisa LenzValerie Enoch, Care Coordinator [facility] 09/23/2014 8:35 AM  Signature:  Aloha GellKrista Dopson, RN 09/23/2014 8:35 AM  Signature: 09/23/2014  8:35 AM  Signature:   Onnie BoerJennifer Clark, RN [facility] 09/23/2014  8:35 AM  Signature:   09/23/2014  8:35 AM    Scribe for Treatment Team:   Juline PatchQuylle Kentavius Dettore,  09/23/2014 8:35 AM

## 2014-09-23 NOTE — Clinical Social Work Note (Signed)
CSW met with patient to complete PSA.  Patient is declining follow up stating he has no history of having problems and is upset that father and sister have made a report that he is suicidal.
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
## 2014-09-23 NOTE — Progress Notes (Addendum)
Patient ID: Keith Parks, male   DOB: 1995/12/05, 18 y.o.   MRN: [id number]   D: 18 year old male presents with a flat affect and depressed mood. Pt has minimal interaction with staff and other pt's. Per self inventory, pt rates depression at a 0, hopelessness 0 and anxiety 0. Pt's daily goal is "getting out" and he intends to do so by "get answers." Pt states that being here is only making things worse. When asked to explain he said "things with my sister and dad." "I don't want to go back to stay with them, I want to live with my uncle." Pt is low energy and has a depressed mood.     A: Pt supported and emotionally encouraged. Pt consulted in a 1:1. Writer spoke with pt's mother. Writer consulted with physician about plan of care for pt.   R: Pt refused to speak with father on the phone however spoke with mom when she called. Pt's safety ensured with 15 minute checks and hourly rounding.Pt currently denies SI/HI and A/V hallucinations. Pt verbally agrees to seek staff if SI/HI or A/VH occurs and to consult with staff before acting on these thoughts.    Aurora Maskwyman, Dorlisa Savino E, RN

## 2014-09-23 NOTE — BHH Suicide Risk Assessment (Signed)
Suicide Risk Assessment  Admission Assessment     Nursing information obtained from:  Patient Demographic factors:  Male, Adolescent or young adult, Caucasian, Unemployed Current Mental Status:  NA Loss Factors:  Decrease in vocational status, Financial problems / change in socioeconomic status Historical Factors:  NA Risk Reduction Factors:  Living with another person, especially a relative Total Time spent with patient: 45 minutes  CLINICAL FACTORS:   Depression:   Anhedonia  COGNITIVE FEATURES THAT CONTRIBUTE TO RISK:  Closed-mindedness Polarized thinking Thought constriction (tunnel vision)    SUICIDE RISK:   Mild:  Suicidal ideation of limited frequency, intensity, duration, and specificity.  There are no identifiable plans, no associated intent, mild dysphoria and related symptoms, good self-control (both objective and subjective assessment), few other risk factors, and identifiable protective factors, including available and accessible social support.  PLAN OF CARE:   Supportive approach/coping skills                                Get more information                                Encourage to consider antidepressants  I certify that inpatient services furnished can reasonably be expected to improve the patient's condition.  Keith Parks A 09/23/2014, 6:25 PM

## 2014-09-24 DIAGNOSIS — F331 Major depressive disorder, recurrent, moderate: Secondary | ICD-10-CM | POA: Insufficient documentation

## 2014-09-24 DIAGNOSIS — F332 Major depressive disorder, recurrent severe without psychotic features: Principal | ICD-10-CM

## 2014-09-24 NOTE — Progress Notes (Signed)
D. Pt has been up, however isolative to room much of the night, minimal interaction or participation in the milieu. Pt does appear flat and withdrawn, however reports that he is doing fine and does not request anything. Pt also did not want any sleep medications and reports that he sleeps fine. A. Support and encouragement provided. R. Safety maintained, will continue to monitor.

## 2014-09-24 NOTE — [facility] Group Notes (Signed)
0900 nursing orientation group   The focus of this group is to educate the patient on the purpose and policies of crisis stabilization and provide a format to answer questions about their admission.  The group details unit policies and expectations of patients while admitted.   Pt was appropriate and was an active participant in group.    

## 2014-09-24 NOTE — [facility] Suicide Risk Assessment (Signed)
Suicide Risk Assessment  Discharge Assessment     Demographic Factors:  Male, Adolescent or young adult and Caucasian  Total Time spent with patient: 45 minutes  Psychiatric Specialty Exam:     Blood pressure 122/64, pulse 83, temperature 97.9 F (36.6 C), temperature source Oral, resp. rate 16, height 5' 10.75" (1.797 m), weight 69.4 kg (153 lb), SpO2 99 %.Body mass index is 21.49 kg/(m^2).  General Appearance: Fairly Groomed  Patent attorneyye Contact::  Fair  Speech:  Clear and Coherent  Volume:  Normal  Mood:  Euthymic  Affect:  Appropriate  Thought Process:  Coherent and Goal Directed  Orientation:  Full (Time, Place, and Person)  Thought Content:  plans as he moves on  Suicidal Thoughts:  No  Homicidal Thoughts:  No  Memory:  Immediate;   Fair Recent;   Fair Remote;   Fair  Judgement:  Fair  Insight:  Present and Shallow  Psychomotor Activity:  Normal  Concentration:  Fair  Recall:  FiservFair  Fund of Knowledge:NA  Language: Fair  Akathisia:  No  Handed:    AIMS (if indicated):     Assets:  Desire for Improvement Housing Social Support  Sleep:       Musculoskeletal: Strength & Muscle Tone: within normal limits Gait & Station: normal Patient leans: N/A   Mental Status Per Nursing Assessment::   On Admission:  NA  Current Mental Status by Physician: In full contact with reality. He denies SI plans or intent. His mood is euthymic. He plans to stay with his mother tonight and got to church with her in the morning. He is trying to stay with his uncle and pursue [place] opportunities in [place]. He states he will be willing to seek counseling if he felt he needed it. States he has some friends he is close to that he can get in touch with.   Loss Factors: Loss of significant relationship  Historical Factors: NA  Risk Reduction Factors:   Sense of responsibility to family, Living with another person, especially a relative and Positive social support  Continued Clinical  Symptoms: None identified   Cognitive Features That Contribute To Risk: Closed minded  Suicide Risk:  Minimal: No identifiable suicidal ideation.  Patients presenting with no risk factors but with morbid ruminations; may be classified as minimal risk based on the severity of the depressive symptoms  Discharge Diagnoses:   AXIS I:  Adjustment Disorder with Mixed Emotional Features AXIS II:  No diagnosis AXIS III:  History reviewed. No pertinent past medical history. AXIS IV:  other psychosocial or environmental problems AXIS V:  61-70 mild symptoms  Plan Of Care/Follow-up recommendations:  Activity:  as tolerated Diet:  regular Follow up outpatient counseling if he so chooses to do Is patient on multiple antipsychotic therapies at discharge:  No   Has Patient had three or more failed trials of antipsychotic monotherapy by history:  No  Recommended Plan for Multiple Antipsychotic Therapies: NA    Larhonda Dettloff A 09/24/2014, 3:31 PM

## 2014-09-24 NOTE — Progress Notes (Signed)
[facility] Group Notes:  (Nursing/MHT/Case Management/Adjunct)  Date:  09/24/2014  Time:  1:25 PM  Type of Therapy:  Therapeutic Activity  Participation Level:  Did not attend  Keith Parks C 09/24/2014, 1:25 PM 

## 2014-09-24 NOTE — Progress Notes (Signed)
D) Pt is being discharged to home, accompanied by his family. Mood and affect are appropriate. Pt denies SI and HI. Rates his depression at a 0, hopelessness at a 0 and his anxiety at a 0. States that he is feeling much better overall and feels ready to go home. A) Given support, reassurance and praise. Encouragement given. All belongings returned to Pt. All discharge plans explained to Pt along with his medications. R) Pt denies SI and HI

## 2014-09-24 NOTE — [facility] Group Notes (Signed)
[facility]  09/24/2014 3:00 PM  Type of Therapy:  Group Therapy  Participation Level:  Did Not Attend  Beverly Sessionsywan J Lindsey MSW, LCSW   Clide DalesHarrill, Catherine Campbell 09/24/2014, 3:00 PM

## 2014-09-24 NOTE — Discharge Summary (Signed)
Physician Discharge Summary Note  Patient:  Keith Parks is an 18 y.o., male MRN:  [id number] DOB:  03-Oct-1996 Patient phone:  [phone] (home)  Patient address:   [place] [address],  Total Time spent with patient: 45 minutes  Date of Admission:  09/22/2014 Date of Discharge: 09/24/2014  Reason for Admission:  Suicidal ideation  Discharge Diagnoses: Active Problems:   MDD (major depressive disorder)   Psychiatric Specialty Exam: Physical Exam  Psychiatric: He has a normal mood and affect. His speech is normal and behavior is normal. Judgment and thought content normal. Cognition and memory are normal.    Review of Systems  Constitutional: Negative.   HENT: Negative.   Eyes: Negative.   Respiratory: Negative.   Cardiovascular: Negative.   Gastrointestinal: Negative.   Genitourinary: Negative.   Musculoskeletal: Negative.   Skin: Negative.   Neurological: Negative.   Endo/Heme/Allergies: Negative.   Psychiatric/Behavioral: Positive for depression (Hx of, chronic). Negative for suicidal ideas, hallucinations, memory loss and substance abuse. The patient is nervous/anxious (Hx of, chronic) and has insomnia (Chronic).     Blood pressure 122/64, pulse 83, temperature 97.9 F (36.6 C), temperature source Oral, resp. rate 16, height 5' 10.75" (1.797 m), weight 69.4 kg (153 lb), SpO2 99 %.Body mass index is 21.49 kg/(m^2).   Past Psychiatric History: Diagnosis:  Hospitalizations:Denies  Outpatient Care: Denies   Substance Abuse Care:Denies  Self-Mutilation:Denies  Suicidal Attempts:Denies  Violent Behaviors:Denies   Musculoskeletal: Strength & Muscle Tone: within normal limits Gait & Station: normal Patient leans: N/A  DSM5:  Schizophrenia Disorders:  NA Obsessive-Compulsive Disorders:  NA Trauma-Stressor Disorders:  NA Substance/Addictive Disorders:  NA Depressive Disorders:  Major Depressive Disorder - Severe (296.23)  Axis Diagnosis:    AXIS I:  Major Depression, Recurrent severe AXIS II:  Deferred AXIS III:  History reviewed. No pertinent past medical history. AXIS IV:  economic problems, occupational problems and other psychosocial or environmental problems AXIS V:  61-70 mild symptoms  Level of Care:  OP  Hospital Course:   Keith Parks is a 18 Y/o male who states that his sister misinterpreted something he wrote in the internet.  She thought that he was trying to commit suicide.   The initial assessment at the ED is as follows:  Keith Parks is a 18 y.o. male patient presenting to the [facility] under IVC Papers filed by his sister Jerimy Johanson. Pt currently minimizes any suicidal ideation or thoughts of depression although presents with flat affect. Pt also denies HI, and AVH. Pt denies any recent triggering events and reports that this is a "misunderstanding from a facebook post about nearing his end of time," which he states was a reference to being near his bedtime. Pt denies ETOH and illicit drug use, aside from a "contact high for weed". Denies any present or past psych history. However, this NP was able to reach the pt's sister, Gabriel Cirri, and father, Shanon Brow for collateral information. The pt's sister states that pt has made multiple Facebook posts about failed suicide attempts (at least 3 per her report) and that he will "have another chance to do it right this time". The sister was extremely concerned about the pt, stating that he has been very depressed and isolating himself x 3 months and that he often will sleep in his car or not return for many days at a time, which no calls, refusal to answer his phone, and no information given to family about his whereabouts.   Patient  was able to get his moods re-stabilized enough to be discharged home safely.  At time of discharge, he  rated both depression and anxiety levels to be manageable and minimal.  He was able to identify the triggers of his emotional crises and  de-stabilizations.  Patient  identified the positive things in his life that would help him deal better with depression.  He did well with the medications prescribed for him.  Denies physiological concerns/SI/HI/AVH at time of discharge.  He  has satisfactory support network and home environment and will adhere to medication compliance and outpatient treatment.     Consults:  psychiatry  Significant Diagnostic Studies:  labs: Per ED  Discharge Vitals:   Blood pressure 122/64, pulse 83, temperature 97.9 F (36.6 C), temperature source Oral, resp. rate 16, height 5' 10.75" (1.797 m), weight 69.4 kg (153 lb), SpO2 99 %. Body mass index is 21.49 kg/(m^2). Lab Results:   Results for orders placed or performed during the hospital encounter of 09/22/14 (from the past 72 hour(s))  CBC WITH DIFFERENTIAL     Status: None   Collection Time: 09/22/14  1:52 AM  Result Value Ref Range   WBC 7.4 4.0 - 10.5 K/uL   RBC 4.59 4.22 - 5.81 MIL/uL   Hemoglobin 14.3 13.0 - 17.0 g/dL   HCT 40.8 39.0 - 52.0 %   MCV 88.9 78.0 - 100.0 fL   MCH 31.2 26.0 - 34.0 pg   MCHC 35.0 30.0 - 36.0 g/dL   RDW 12.7 11.5 - 15.5 %   Platelets 257 150 - 400 K/uL   Neutrophils Relative % 55 43 - 77 %   Neutro Abs 4.0 1.7 - 7.7 K/uL   Lymphocytes Relative 32 12 - 46 %   Lymphs Abs 2.4 0.7 - 4.0 K/uL   Monocytes Relative 11 3 - 12 %   Monocytes Absolute 0.8 0.1 - 1.0 K/uL   Eosinophils Relative 2 0 - 5 %   Eosinophils Absolute 0.1 0.0 - 0.7 K/uL   Basophils Relative 0 0 - 1 %   Basophils Absolute 0.0 0.0 - 0.1 K/uL  Comprehensive metabolic panel     Status: Abnormal   Collection Time: 09/22/14  1:52 AM  Result Value Ref Range   Sodium 141 137 - 147 mEq/L   Potassium 3.5 (L) 3.7 - 5.3 mEq/L   Chloride 103 96 - 112 mEq/L   CO2 28 19 - 32 mEq/L   Glucose, Bld 108 (H) 70 - 99 mg/dL   BUN 9 6 - 23 mg/dL   Creatinine, Ser 0.86 0.50 - 1.35 mg/dL   Calcium 9.4 8.4 - 10.5 mg/dL   Total Protein 7.4 6.0 - 8.3 g/dL   Albumin  4.4 3.5 - 5.2 g/dL   AST 13 0 - 37 U/L   ALT 15 0 - 53 U/L   Alkaline Phosphatase 82 39 - 117 U/L   Total Bilirubin 0.5 0.3 - 1.2 mg/dL   GFR calc non Af Amer >90 >90 mL/min   GFR calc Af Amer >90 >90 mL/min    Comment: (NOTE) The eGFR has been calculated using the CKD EPI equation. This calculation has not been validated in all clinical situations. eGFR's persistently <90 mL/min signify possible Chronic Kidney Disease.    Anion gap 10 5 - 15  Ethanol     Status: None   Collection Time: 09/22/14  1:52 AM  Result Value Ref Range   Alcohol, Ethyl (B) <11 0 - 11 mg/dL  Comment:        LOWEST DETECTABLE LIMIT FOR SERUM ALCOHOL IS 11 mg/dL FOR MEDICAL PURPOSES ONLY   Drug screen panel, emergency     Status: Abnormal   Collection Time: 09/22/14  1:55 AM  Result Value Ref Range   Opiates NONE DETECTED NONE DETECTED   Cocaine NONE DETECTED NONE DETECTED   Benzodiazepines NONE DETECTED NONE DETECTED   Amphetamines NONE DETECTED NONE DETECTED   Tetrahydrocannabinol POSITIVE (A) NONE DETECTED   Barbiturates NONE DETECTED NONE DETECTED    Comment:        DRUG SCREEN FOR MEDICAL PURPOSES ONLY.  IF CONFIRMATION IS NEEDED FOR ANY PURPOSE, NOTIFY LAB WITHIN 5 DAYS.        LOWEST DETECTABLE LIMITS FOR URINE DRUG SCREEN Drug Class       Cutoff (ng/mL) Amphetamine      1000 Barbiturate      200 Benzodiazepine   923 Tricyclics       300 Opiates          300 Cocaine          300 THC              50     Physical Findings: AIMS: Facial and Oral Movements Muscles of Facial Expression: None, normal Lips and Perioral Area: None, normal Jaw: None, normal Tongue: None, normal,Extremity Movements Upper (arms, wrists, hands, fingers): None, normal Lower (legs, knees, ankles, toes): None, normal, Trunk Movements Neck, shoulders, hips: None, normal, Overall Severity Severity of abnormal movements (highest score from questions above): None, normal Incapacitation due to abnormal  movements: None, normal Patient's awareness of abnormal movements (rate only patient's report): No Awareness, Dental Status Current problems with teeth and/or dentures?: No Does patient usually wear dentures?: No  CIWA:    COWS:     Psychiatric Specialty Exam: See Psychiatric Specialty Exam and Suicide Risk Assessment completed by Attending Physician prior to discharge.  Discharge destination:  Home  Is patient on multiple antipsychotic therapies at discharge:  No   Has Patient had three or more failed trials of antipsychotic monotherapy by history:  No  Recommended Plan for Multiple Antipsychotic Therapies: NA     Medication List    STOP taking these medications        ibuprofen 200 MG tablet  Commonly known as:  ADVIL,MOTRIN           Follow-up Information    Follow up with Patient is refusing follow up.      Follow-up recommendations:  Activity:  As tolerated Diet:  As tolerated  Comments:  Take all medications as prescribed. Keep all follow-up appointments as scheduled.  Do not consume alcohol or use illegal drugs while on prescription medications. Report any adverse effects from your medications to your primary care provider promptly.  In the event of recurrent symptoms or worsening symptoms, call 911, a crisis hotline, or go to the nearest emergency department for evaluation.   Total Discharge Time:  Greater than 30 minutes.  SignedKerrie Buffalo MAY, AGNP-BC 09/24/2014, 3:27 PM

## 2014-09-24 NOTE — Progress Notes (Addendum)
[facility] Group Notes:  (Nursing/MHT/Case Management/Adjunct)  Date:  09/24/2014  Time:  3:48 PM  Type of Therapy:  Therapeutic Activity  Participation Level:  Did not attend   Keith Parks C 09/24/2014, 3:48 PM 

## 2014-09-24 NOTE — [facility] Group Notes (Signed)
[facility] Group Notes:  (Nursing/MHT/Case Management/Adjunct)  Date:  09/24/2014  Time:  3:38 PM  Type of Therapy:  Psychoeducational Skills  Participation Level:  Did Not Attend  Participation Quality:  did not attend  Affect:  did not attend  Cognitive:  did not attend  Insight:  None  Engagement in Group:  did not attend  Modes of Intervention:  did not attend  Summary of Progress/Problems:pt did attend he was in bed asleep and refused to get up for the group.   Jule SerKent, Elanor Cale Gail 09/24/2014, 3:38 PM

## 2014-09-28 NOTE — Progress Notes (Signed)
Patient Discharge Instructions:  No documentation was faxed for [facility]IPS.  Per the SW the patient refused follow up.  Jerelene ReddenSheena E Dyer, 09/28/2014, 12:41 PM

## 2016-02-23 ENCOUNTER — Other Ambulatory Visit (HOSPITAL_COMMUNITY)
Admission: RE | Admit: 2016-02-23 | Discharge: 2016-02-23 | Disposition: A | Discharge status: Home / Self Care | Payer: Self-pay | Source: Ambulatory Visit | Attending: Preventative Medicine | Admitting: Preventative Medicine

## 2016-02-23 DIAGNOSIS — Z00129 Encounter for routine child health examination without abnormal findings: Secondary | ICD-10-CM | POA: Insufficient documentation

## 2016-02-23 LAB — CBC WITH DIFFERENTIAL/PLATELET
BASOS PCT: 0 %
Basophils Absolute: 0 10*3/uL (ref 0.0–0.1)
EOS PCT: 0 %
Eosinophils Absolute: 0 10*3/uL (ref 0.0–0.7)
HCT: 41.7 % (ref 39.0–52.0)
Hemoglobin: 14.4 g/dL (ref 13.0–17.0)
LYMPHS ABS: 1.9 10*3/uL (ref 0.7–4.0)
Lymphocytes Relative: 24 %
MCH: 31.4 pg (ref 26.0–34.0)
MCHC: 34.5 g/dL (ref 30.0–36.0)
MCV: 91 fL (ref 78.0–100.0)
Monocytes Absolute: 0.6 10*3/uL (ref 0.1–1.0)
Monocytes Relative: 8 %
Neutro Abs: 5.3 10*3/uL (ref 1.7–7.7)
Neutrophils Relative %: 68 %
PLATELETS: 243 10*3/uL (ref 150–400)
RBC: 4.58 MIL/uL (ref 4.22–5.81)
RDW: 12.8 % (ref 11.5–15.5)
WBC: 7.8 10*3/uL (ref 4.0–10.5)

## 2016-02-23 LAB — HEPATIC FUNCTION PANEL
ALBUMIN: 5 g/dL (ref 3.5–5.0)
ALT: 12 U/L — ABNORMAL LOW (ref 17–63)
AST: 17 U/L (ref 15–41)
Alkaline Phosphatase: 67 U/L (ref 38–126)
BILIRUBIN DIRECT: 0.2 mg/dL (ref 0.1–0.5)
BILIRUBIN INDIRECT: 0.6 mg/dL (ref 0.3–0.9)
Total Bilirubin: 0.8 mg/dL (ref 0.3–1.2)
Total Protein: 7.3 g/dL (ref 6.5–8.1)

## 2019-09-27 ENCOUNTER — Ambulatory Visit (HOSPITAL_COMMUNITY)
Admission: AD | Admit: 2019-09-27 | Discharge: 2019-09-27 | Disposition: A | Discharge status: Home / Self Care | Payer: Self-pay | Attending: Psychiatry | Admitting: Psychiatry

## 2019-09-27 DIAGNOSIS — F410 Panic disorder [episodic paroxysmal anxiety] without agoraphobia: Secondary | ICD-10-CM

## 2019-09-27 DIAGNOSIS — R45851 Suicidal ideations: Secondary | ICD-10-CM

## 2019-09-27 DIAGNOSIS — F411 Generalized anxiety disorder: Secondary | ICD-10-CM | POA: Insufficient documentation

## 2019-09-27 DIAGNOSIS — F332 Major depressive disorder, recurrent severe without psychotic features: Secondary | ICD-10-CM | POA: Insufficient documentation

## 2019-09-27 NOTE — BH Assessment (Signed)
Assessment Note  Keith Parks is an 23 y.o. male.  -Patient asked his father to bring him in to [facility].  Pt says he has increased depression and anxiety.  Having suicidal thoughts.  Patient is suicidal w/ plan ot do "suicide by cop" or overdose.  Patient is evasive when asked if he has access to guns he says "I would rather not answer that."  It must be presumed that he does.  Pt denies any previous suicide attempts.    Pt has no HI at this time.  He denies any A/V hallucinations.    Patient says he smokes marijuana daily.  He reports also that he will drink when he cannot get marijuana.  His last use of ETOH was on 11/08.  Pt says he used marijuana prior to arrival.  Patient reports having anxiety to the point that he can barely go out to the store or do any other functions in public.  Patient says that "it is affecting most of my life."  Patient came back to [place] from [place].  He had been in the [employer].  It is not clear when he moved back to [place].  Patient says that he and his wife are separated now also and he is currently unemployed.  Patient has good eye contact.  He says he feels a little safer being in a mental health facility.  Patient speaks in a low, but even tone.  He said he was anxious and depressed and this is congruent w/ presentation.  Pt is oriented x4.  He is not responding to internal stimuli.    Patient was at [facility] in 09/2014.  He has no current outpatient provider.    -Clinician spoke with Lindon Romp, FNP who recommends inpatient care.  Diagnosis: F33.2 MDD recurrent, severe; F41.1 Generalized anxiety d/o  Past Medical History: No past medical history on file.  No past surgical history on file.  Family History: No family history on file.  Social History:  reports that he has never smoked. He has never used smokeless tobacco. He reports that he does not drink alcohol or use drugs.  Additional Social History:  Alcohol / Drug Use Pain Medications: None Prescriptions:  None Over the Counter: None History of alcohol / drug use?: Yes Withdrawal Symptoms: Patient aware of relationship between substance abuse and physical/medical complications, Sweats Substance #1 Name of Substance 1: Marijuana 1 - Age of First Use: 23 years of age 31 - Amount (size/oz): A gram per day (oil) and two grams if regular marijuana. 1 - Frequency: Daily 1 - Duration: ongong 1 - Last Use / Amount: 11/09.  Will smoke to manage panic attacks Substance #2 Name of Substance 2: ETOH 2 - Age of First Use: 23 years of age 48 - Amount (size/oz): 4-6 twelve oz bottles of higher ETOH beverages 2 - Frequency: Will drink when he does not have THC.  Has drank two days out of the last two weeks. 2 - Duration: ongoing 2 - Last Use / Amount: Drank on 11/08.  CIWA: CIWA-Ar BP: (!) 147/96 Pulse Rate: (!) 138 COWS:    Allergies: Not on File  Home Medications: (Not in a hospital admission)   OB/GYN Status:  No LMP for male patient.  General Assessment Data Location of Assessment: [facility] Assessment Services TTS Assessment: In system Is this a Tele or Face-to-Face Assessment?: Face-to-Face Is this an Initial Assessment or a Re-assessment for this encounter?: Initial Assessment Patient Accompanied by:: N/A(Parent brought him in.) Language  Other than English: No What gender do you identify as?: Male Marital status: Separated Pregnancy Status: No Living Arrangements: Other relatives(Staying w/ PGM.) Can pt return to current living arrangement?: Yes Admission Status: Voluntary Is patient capable of signing voluntary admission?: Yes Referral Source: Self/Family/Friend(Father brought him in.) Insurance type: self pay  Medical Screening Exam [facility] Walk-in ONLY) Medical Exam completed: Yes(Jason Allyson Sabal, FNP)  Crisis Care Plan Living Arrangements: Other relatives(Staying w/ PGM.) Name of Psychiatrist: None Name of Therapist: None  Education Status Is patient currently in school?: No Is  the patient employed, unemployed or receiving disability?: Unemployed  Risk to self with the past 6 months Suicidal Ideation: Yes-Currently Present Has patient been a risk to self within the past 6 months prior to admission? : No Suicidal Intent: Yes-Currently Present Has patient had any suicidal intent within the past 6 months prior to admission? : Yes Is patient at risk for suicide?: Yes Suicidal Plan?: Yes-Currently Present Has patient had any suicidal plan within the past 6 months prior to admission? : No Specify Current Suicidal Plan: Suicide by cop or overdose Access to Means: Yes Specify Access to Suicidal Means: confrontation w/ police What has been your use of drugs/alcohol within the last 12 months?: THC & ETOH Previous Attempts/Gestures: No How many times?: 0 Other Self Harm Risks: None Triggers for Past Attempts: None known Intentional Self Injurious Behavior: None Family Suicide History: No Recent stressful life event(s): Turmoil (Comment)(Separated, unemployed) Persecutory voices/beliefs?: No Depression: Yes Depression Symptoms: Despondent, Isolating, Loss of interest in usual pleasures, Feeling worthless/self pity, Insomnia, Tearfulness Substance abuse history and/or treatment for substance abuse?: No Suicide prevention information given to non-admitted patients: Not applicable  Risk to Others within the past 6 months Homicidal Ideation: No Does patient have any lifetime risk of violence toward others beyond the six months prior to admission? : No Thoughts of Harm to Others: No Current Homicidal Intent: No Current Homicidal Plan: No Access to Homicidal Means: No Identified Victim: No one History of harm to others?: No Assessment of Violence: None Noted Violent Behavior Description: None reported Does patient have access to weapons?: No(Pt is evasive.) Criminal Charges Pending?: No Does patient have a court date: No Is patient on probation?:  No  Psychosis Hallucinations: None noted Delusions: None noted  Mental Status Report Appearance/Hygiene: Unremarkable Eye Contact: Good Motor Activity: Freedom of movement, Unremarkable Speech: Logical/coherent Level of Consciousness: Alert Mood: Depressed, Anxious, Despair, Helpless, Sad Affect: Anxious, Depressed Anxiety Level: Panic Attacks Panic attack frequency: Being around other people Most recent panic attack: Today Thought Processes: Coherent, Relevant Judgement: Impaired Orientation: Person, Situation, Place, Time Obsessive Compulsive Thoughts/Behaviors: Minimal  Cognitive Functioning Concentration: Poor Memory: Recent Impaired, Remote Intact Is patient IDD: No Insight: Fair Impulse Control: Fair Appetite: Poor Have you had any weight changes? : Loss Amount of the weight change? (lbs): (Pt does not know.) Sleep: Increased Total Hours of Sleep: (Sleep too much and has to force himself out of bed.  7-12 ho) Vegetative Symptoms: None  ADLScreening [facility] Assessment Services) Patient's cognitive ability adequate to safely complete daily activities?: Yes Patient able to express need for assistance with ADLs?: Yes Independently performs ADLs?: Yes (appropriate for developmental age)  Prior Inpatient Therapy Prior Inpatient Therapy: Yes Prior Therapy Dates: 09/2014 Prior Therapy Facilty/Provider(s): [facility] Reason for Treatment: Was on IVC  Prior Outpatient Therapy Prior Outpatient Therapy: Yes Prior Therapy Dates: May of 2019 Prior Therapy Facilty/Provider(s): 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  : No Does patient have [facility] services? : No Does patient have P4CC services?: No  ADL Screening (condition at time of admission) Patient's cognitive ability adequate to safely complete daily activities?: Yes Is the patient deaf or have difficulty hearing?:  Yes(Some hearing loss from [employer] and [employer] service.) Does the patient have difficulty seeing, even when wearing glasses/contacts?: No Does the patient have difficulty concentrating, remembering, or making decisions?: Yes Patient able to express need for assistance with ADLs?: Yes Does the patient have difficulty dressing or bathing?: No Independently performs ADLs?: Yes (appropriate for developmental age) Does the patient have difficulty walking or climbing stairs?: Yes(Knee injury (right)) Weakness of Legs: Right Weakness of Arms/Hands: None  Home Assistive Devices/Equipment Home Assistive Devices/Equipment: None    Abuse/Neglect Assessment (Assessment to be complete while patient is alone) Abuse/Neglect Assessment Can Be Completed: Yes Physical Abuse: Denies Verbal Abuse: Denies Sexual Abuse: Denies Exploitation of patient/patient's resources: Denies Self-Neglect: Denies     Merchant navy officerAdvance Directives (For Healthcare) Does Patient Have a Medical Advance Directive?: No Would patient like information on creating a medical advance directive?: No - Patient declined          Disposition:  Disposition Initial Assessment Completed for this Encounter: Yes Disposition of Patient: Admit Type of inpatient treatment program: Adult(OBS overnight then adult unit.) Patient refused recommended treatment: No Mode of transportation if patient is discharged/movement?: N/A Patient referred to: Other (Comment)(Admit to OBS overnight then a inpatient bed.)  On Site Evaluation by:   Reviewed with Physician:    Alexandria LodgeHarvey, Finlee Milo Ray 09/27/2019 11:58 PM

## 2019-09-28 ENCOUNTER — Observation Stay (HOSPITAL_COMMUNITY)
Admission: RE | Admit: 2019-09-28 | Discharge: 2019-09-29 | Disposition: A | Discharge status: Home / Self Care | Payer: Self-pay | Attending: Psychiatry | Admitting: Psychiatry

## 2019-09-28 ENCOUNTER — Encounter (HOSPITAL_COMMUNITY): Payer: Self-pay | Admitting: *Deleted

## 2019-09-28 ENCOUNTER — Other Ambulatory Visit: Payer: Self-pay

## 2019-09-28 DIAGNOSIS — F332 Major depressive disorder, recurrent severe without psychotic features: Secondary | ICD-10-CM | POA: Insufficient documentation

## 2019-09-28 DIAGNOSIS — F411 Generalized anxiety disorder: Principal | ICD-10-CM | POA: Insufficient documentation

## 2019-09-28 DIAGNOSIS — Z20828 Contact with and (suspected) exposure to other viral communicable diseases: Secondary | ICD-10-CM | POA: Insufficient documentation

## 2019-09-28 DIAGNOSIS — R45851 Suicidal ideations: Secondary | ICD-10-CM

## 2019-09-28 DIAGNOSIS — F410 Panic disorder [episodic paroxysmal anxiety] without agoraphobia: Secondary | ICD-10-CM

## 2019-09-28 DIAGNOSIS — G4700 Insomnia, unspecified: Secondary | ICD-10-CM | POA: Insufficient documentation

## 2019-09-28 HISTORY — DX: Depression, unspecified: F32.A

## 2019-09-28 HISTORY — DX: Anxiety disorder, unspecified: F41.9

## 2019-09-28 LAB — TSH: TSH: 0.424 u[IU]/mL (ref 0.350–4.500)

## 2019-09-28 LAB — COMPREHENSIVE METABOLIC PANEL
ALT: 17 U/L (ref 0–44)
AST: 13 U/L — ABNORMAL LOW (ref 15–41)
Albumin: 5.3 g/dL — ABNORMAL HIGH (ref 3.5–5.0)
Alkaline Phosphatase: 66 U/L (ref 38–126)
Anion gap: 10 (ref 5–15)
BUN: 11 mg/dL (ref 6–20)
CO2: 28 mmol/L (ref 22–32)
Calcium: 9.9 mg/dL (ref 8.9–10.3)
Chloride: 101 mmol/L (ref 98–111)
Creatinine, Ser: 0.99 mg/dL (ref 0.61–1.24)
GFR calc Af Amer: >60 mL/min (ref >60)
GFR calc non Af Amer: >60 mL/min (ref >60)
Glucose, Bld: 75 mg/dL (ref 70–99)
Potassium: 4.4 mmol/L (ref 3.5–5.1)
Sodium: 139 mmol/L (ref 135–145)
Total Bilirubin: 1.5 mg/dL — ABNORMAL HIGH (ref 0.3–1.2)
Total Protein: 8.1 g/dL (ref 6.5–8.1)

## 2019-09-28 LAB — CBC
HCT: 48.9 % (ref 39.0–52.0)
Hemoglobin: 16.4 g/dL (ref 13.0–17.0)
MCH: 31.1 pg (ref 26.0–34.0)
MCHC: 33.5 g/dL (ref 30.0–36.0)
MCV: 92.6 fL (ref 80.0–100.0)
Platelets: 287 10*3/uL (ref 150–400)
RBC: 5.28 MIL/uL (ref 4.22–5.81)
RDW: 12 % (ref 11.5–15.5)
WBC: 7.7 10*3/uL (ref 4.0–10.5)
nRBC: 0 % (ref 0.0–0.2)

## 2019-09-28 LAB — HEMOGLOBIN A1C
Hgb A1c MFr Bld: 4.8 % (ref 4.8–5.6)
Mean Plasma Glucose: 91.06 mg/dL

## 2019-09-28 LAB — SARS CORONAVIRUS 2 BY RT PCR (HOSPITAL ORDER, PERFORMED IN ~~LOC~~ HOSPITAL LAB): SARS Coronavirus 2: NEGATIVE

## 2019-09-28 MED ORDER — HYDROXYZINE HCL 25 MG PO TABS: 25.0000 mg | ORAL_TABLET | Freq: Three times a day (TID) | ORAL | Status: DC | PRN

## 2019-09-28 MED ORDER — ALUM & MAG HYDROXIDE-SIMETH 200-200-20 MG/5ML PO SUSP: 30.0000 mL | ORAL | Status: DC | PRN

## 2019-09-28 MED ORDER — NICOTINE POLACRILEX 2 MG MT GUM
2.0000 mg | CHEWING_GUM | OROMUCOSAL | Status: DC | PRN
  Administered 2019-09-28 (×2): 2 mg via ORAL
  Filled 2019-09-28 (×4): qty 1

## 2019-09-28 MED ORDER — ACETAMINOPHEN 325 MG PO TABS: 650.0000 mg | ORAL_TABLET | Freq: Four times a day (QID) | ORAL | Status: DC | PRN

## 2019-09-28 MED ORDER — TRAZODONE HCL 50 MG PO TABS
50.0000 mg | ORAL_TABLET | Freq: Every evening | ORAL | Status: DC | PRN
  Administered 2019-09-28: 50 mg via ORAL
  Filled 2019-09-28: qty 1

## 2019-09-28 MED ORDER — MAGNESIUM HYDROXIDE 400 MG/5ML PO SUSP: 30.0000 mL | Freq: Every day | ORAL | Status: DC | PRN

## 2019-09-28 MED ORDER — SERTRALINE HCL 25 MG PO TABS
25.0000 mg | ORAL_TABLET | Freq: Every day | ORAL | Status: DC
  Administered 2019-09-28 – 2019-09-29 (×2): 25 mg via ORAL
  Filled 2019-09-28 (×2): qty 1

## 2019-09-28 NOTE — Progress Notes (Signed)
This is 2nd [facility] inpt admission for this 23yo male, voluntarily admitted as a walk-in to observation unit. Pt reports that he was brought in by his father due to having increasing anxiety and depression. Pt states that he was SI with a plan to either overdose of "suicide by cop." Pt reports that he was station in [place] for 2 years, and came back to [facility] in June 2019. Pt states that he is recently separated from his wife, and he currently lives with his grandmother, unemployed. Pt states that his anxiety has gotten to the point that he barely leaves the home, his sleep routine is inconsistent, and he usually will only go out at night. Pt states that he smokes THC daily, vapes constantly, and will drink ETOH when he is unable to smoke. Pt's last ETOH use was x2 days ago, and pt last THC use was before being admitted tonight. Pt states that he has panic attacks a lot. Pt currently has no outpt provider, and no home meds. Pt reports that he sees "random girls" and has no family to talk to. Pt currently denies SI/HI or hallucinations (a) 15 min checks (r) safety maintained.

## 2019-09-28 NOTE — Progress Notes (Signed)
Patient ID: Keith Parks, male   DOB: 07/03/1996, 23 y.o.   MRN: [id number] Pt A&O x 4, no distress noted, calm & cooperative at present.  Resting at present.  Monitoring for safety.

## 2019-09-28 NOTE — Care Management (Signed)
  Under review [facility], [facility], [facility], [facility], [facility], [facility], [facility], [facility], [facility], [facility], [facility], [facility], [facility], [facility], [facility], [facility], [facility], [facility], [facility], [facility], [facility], [facility], [facility], [facility], [facility], [facility], [facility], [facility], [facility], [facility], [facility], [place], [facility], [facility], [facility]  

## 2019-09-28 NOTE — Progress Notes (Addendum)
[facility] MD Progress Note  09/28/2019 5:10 PM Keith Parks  MRN:  [id number]   Subjective:  Keith Parks, 23 y.o., male patient seen face to face by this provider, Dr. Parke Parks; and chart reviewed on 09/28/19.  On evaluation Keith Parks reports he came to the hospital related to worsening anxiety and panic attacks.  Patient reports whenever he leaves his home or outside his comfortable area he has a panic attack.  Reports suicidal ideation is related to the worsening of panic attacks but has no plan.  "  Whenever I leave out of my house or out of my neighborhood I cannot function is like a panic."  Patient reports that he lives with his grandmother and that he is unemployed at this time. 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  Patient states that he has been using marijuana.  Patient has no children.  Patient also reports that he has been on psychotropic medications before but he cannot remember what he was taking reports that he knows he has taken trazodone before and it helps with sleep.  At this time patient continues to endorse suicidal ideation but denies homicidal ideations, psychosis, and paranoia. During evaluation Keith Parks is alert/oriented x 4; calm/cooperative; and mood is congruent with affect.  He does not appear to be responding to internal/external stimuli or delusional thoughts.  Patient denies homicidal ideation, psychosis, and paranoia.  Patient answered question appropriately.  Patient is unable to contract for safety recommended inpatient psychiatric treatment   Principal Problem: Generalized anxiety disorder with panic attacks Diagnosis: Principal Problem:   Generalized anxiety disorder with panic attacks Active Problems:   Suicidal ideation   Severe recurrent major depression without psychotic features ([place])  Total Time spent with patient: 30 minutes  Past Psychiatric History: Major depression  Past Medical History:  Past Medical History:  Diagnosis  Date  . Anxiety   . Depression    History reviewed. No pertinent surgical history. Family History: History reviewed. No pertinent family history. Family Psychiatric  History: Unaware Social History:  Social History   Substance and Sexual Activity  Alcohol Use Yes   Comment: drinks when THC not available     Social History   Substance and Sexual Activity  Drug Use Yes  . Types: Marijuana   Comment: everyday    Social History   Socioeconomic History  . Marital status: Legally Separated    Spouse name: Not on file  . Number of children: Not on file  . Years of education: Not on file  . Highest education level: Not on file  Occupational History  . Not on file  Social Needs  . Financial resource strain: Not on file  . Food insecurity    Worry: Not on file    Inability: Not on file  . Transportation needs    Medical: Not on file    Non-medical: Not on file  Tobacco Use  . Smoking status: Never Smoker  . Smokeless tobacco: Never Used  Substance and Sexual Activity  . Alcohol use: Yes    Comment: drinks when THC not available  . Drug use: Yes    Types: Marijuana    Comment: everyday  . Sexual activity: Yes    Birth control/protection: Condom  Lifestyle  . Physical activity    Days per week: Not on file    Minutes per session: Not on file  . Stress: Not on file  Relationships  . Social connections  Talks on phone: Not on file    Gets together: Not on file    Attends religious service: Not on file    Active member of club or organization: Not on file    Attends meetings of clubs or organizations: Not on file    Relationship status: Not on file  Other Topics Concern  . Not on file  Social History Narrative  . Not on file   Additional Social History:    Pain Medications: pt denies                    Sleep: Fair  Appetite:  Fair  Current Medications: Current Facility-Administered Medications  Medication Dose Route Frequency Provider Last Rate  Last Dose  . acetaminophen (TYLENOL) tablet 650 mg  650 mg Oral Q6H PRN Jackelyn Poling, NP      . alum & mag hydroxide-simeth (MAALOX/MYLANTA) 200-200-20 MG/5ML suspension 30 mL  30 mL Oral Q4H PRN Nira Conn A, NP      . hydrOXYzine (ATARAX/VISTARIL) tablet 25 mg  25 mg Oral TID PRN Nira Conn A, NP      . magnesium hydroxide (MILK OF MAGNESIA) suspension 30 mL  30 mL Oral Daily PRN Nira Conn A, NP      . nicotine polacrilex (NICORETTE) gum 2 mg  2 mg Oral PRN Nira Conn A, NP   2 mg at 09/28/19 0133  . traZODone (DESYREL) tablet 50 mg  50 mg Oral QHS PRN Jackelyn Poling, NP        Lab Results:  Results for orders placed or performed during the hospital encounter of 09/28/19 (from the past 48 hour(s))  SARS Coronavirus 2 by RT PCR (hospital order, performed in [facility] hospital lab) Nasopharyngeal Nasopharyngeal Swab     Status: None   Collection Time: 09/28/19  3:16 AM   Specimen: Nasopharyngeal Swab  Result Value Ref Range   SARS Coronavirus 2 NEGATIVE NEGATIVE    Comment: (NOTE) If result is NEGATIVE SARS-CoV-2 target nucleic acids are NOT DETECTED. The SARS-CoV-2 RNA is generally detectable in upper and lower  respiratory specimens during the acute phase of infection. The lowest  concentration of SARS-CoV-2 viral copies this assay can detect is 250  copies / mL. A negative result does not preclude SARS-CoV-2 infection  and should not be used as the sole basis for treatment or other  patient management decisions.  A negative result may occur with  improper specimen collection / handling, submission of specimen other  than nasopharyngeal swab, presence of viral mutation(s) within the  areas targeted by this assay, and inadequate number of viral copies  (<250 copies / mL). A negative result must be combined with clinical  observations, patient history, and epidemiological information. If result is POSITIVE SARS-CoV-2 target nucleic acids are DETECTED. The SARS-CoV-2 RNA  is generally detectable in upper and lower  respiratory specimens dur ing the acute phase of infection.  Positive  results are indicative of active infection with SARS-CoV-2.  Clinical  correlation with patient history and other diagnostic information is  necessary to determine patient infection status.  Positive results do  not rule out bacterial infection or co-infection with other viruses. If result is PRESUMPTIVE POSTIVE SARS-CoV-2 nucleic acids MAY BE PRESENT.   A presumptive positive result was obtained on the submitted specimen  and confirmed on repeat testing.  While 2019 novel coronavirus  (SARS-CoV-2) nucleic acids may be present in the submitted sample  additional confirmatory testing may  be necessary for epidemiological  and / or clinical management purposes  to differentiate between  SARS-CoV-2 and other Sarbecovirus currently known to infect humans.  If clinically indicated additional testing with an alternate test  methodology [phone]) is advised. The SARS-CoV-2 RNA is generally  detectable in upper and lower respiratory sp ecimens during the acute  phase of infection. The expected result is Negative. Fact Sheet for Patients:  BoilerBrush.com.cyhttps://www.fda.gov/media/136312/download Fact Sheet for Healthcare Providers: https://pope.com/https://www.fda.gov/media/136313/download This test is not yet approved or cleared by the [place] FDA and has been authorized for detection and/or diagnosis of SARS-CoV-2 by FDA under an Emergency Use Authorization (EUA).  This EUA will remain in effect (meaning this test can be used) for the duration of the COVID-19 declaration under Section 564(b)(1) of the Act, 21 U.S.C. section 360bbb-3(b)(1), unless the authorization is terminated or revoked sooner. Performed at [facility], [address] [place], [place] [address]     Blood Alcohol level:  Lab Results  Component Value Date   [facility]    Metabolic Disorder Labs: No  results found for: HGBA1C, MPG No results found for: PROLACTIN No results found for: CHOL, TRIG, HDL, CHOLHDL, VLDL, LDLCALC  Physical Findings: AIMS: Facial and Oral Movements Muscles of Facial Expression: None, normal Lips and Perioral Area: None, normal Jaw: None, normal Tongue: None, normal,Extremity Movements Upper (arms, wrists, hands, fingers): None, normal Lower (legs, knees, ankles, toes): None, normal, Trunk Movements Neck, shoulders, hips: None, normal, Overall Severity Severity of abnormal movements (highest score from questions above): None, normal Incapacitation due to abnormal movements: None, normal Patient's awareness of abnormal movements (rate only patient's report): No Awareness, Dental Status Current problems with teeth and/or dentures?: No Does patient usually wear dentures?: No  CIWA:  CIWA-Ar Total: 7 COWS:     Musculoskeletal: Strength & Muscle Tone: within normal limits Gait & Station: normal Patient leans: N/A  Psychiatric Specialty Exam: Physical Exam  Nursing note and vitals reviewed. Constitutional: He is oriented to person, place, and time. He appears well-developed and well-nourished. No distress.  Neck: Normal range of motion.  Respiratory: Effort normal.  Musculoskeletal: Normal range of motion.  Neurological: He is alert and oriented to person, place, and time.  Skin: Skin is warm and dry.  Psychiatric: His speech is normal and behavior is normal. His mood appears anxious. Cognition and memory are normal. He expresses impulsivity. He exhibits a depressed mood. He expresses suicidal ideation. He expresses no suicidal plans.    Review of Systems  Psychiatric/Behavioral: Positive for depression, substance abuse (THC) and suicidal ideas. Hallucinations: Denies. The patient is nervous/anxious and has insomnia.   All other systems reviewed and are negative.   There were no vitals taken for this visit.There is no height or weight on file to  calculate BMI.  General Appearance: Casual  Eye Contact:  Good  Speech:  Clear and Coherent and Normal Rate  Volume:  Normal  Mood:  Anxious, Depressed and Hopeless  Affect:  Congruent  Thought Process:  Coherent, Goal Directed and Descriptions of Associations: Intact  Orientation:  Full (Time, Place, and Person)  Thought Content:  WDL  Suicidal Thoughts:  Yes.  without intent/plan  Homicidal Thoughts:  No  Memory:  Immediate;   Good Recent;   Good  Judgement:  Fair  Insight:  Present  Psychomotor Activity:  Normal  Concentration:  Concentration: Good and Attention Span: Good  Recall:  Good  Fund of Knowledge:  Good  Language:  Good  Akathisia:  No  Handed:  Right  AIMS (if indicated):     Assets:  Communication Skills Desire for Improvement Housing Social Support  ADL's:  Intact  Cognition:  WNL  Sleep:      Treatment Plan Summary: Daily contact with patient to assess and evaluate symptoms and progress in treatment, Medication management and Plan Inpatient psychiatric treatment   Medication management: Vistaril 25 mg 3 times daily as needed anxiety Trazodone 50 mg nightly as needed anxiety Zoloft 25 mg daily for anxiety and panic attacks.  Shuvon Rankin, NP 09/28/2019, 5:10 PM  Attest to NP Progress Note

## 2019-09-28 NOTE — H&P (Signed)
BH Observation Unit Provider Admission PAA/H&P  Patient Identification: Keith BoozeMicheal A Parks MRN:  [id number] Date of Evaluation:  09/28/2019 Chief Complaint:  MDD Principal Diagnosis: Severe recurrent major depression without psychotic features (HCC) Diagnosis:  Principal Problem:   Severe recurrent major depression without psychotic features (HCC) Active Problems:   Suicidal ideation  History of Present Illness:   TTS Assessment:  Keith Parks is an 23 y.o. male. Patient asked his father to [facility].  Pt says he has increased depression and anxiety.  Having suicidal thoughts.Patient is suicidal w/ plan ot do "suicide by cop" or overdose.  Patient is evasive when asked if he has access to guns he says "I would rather not answer that."  It must be presumed that he does.  Pt denies any previous suicide attempts.  Pt has no HI at this time.  He denies any A/V hallucinations. Patient says he smokes marijuana daily.  He reports also that he will drink when he cannot get marijuana.  His last use of ETOH was on 11/08.  Pt says he used marijuana prior to arrival. Patient reports having anxiety to the point that he can barely go out to the store or do any other functions in public.  Patient says that "it is affecting most of my life."  Patient came back to [place] from [place].  He had been in the [employer].  It is not clear when he moved back to [place].  Patient says that he and his wife are separated now also and he is currently unemployed.  Evaluation on Unit: Reviewed TTS assessment and validated with patient. Pt reports that he was brought in by his father due to having increasing anxiety and depression. Pt states that he was SI with a plan to either overdose of "suicide by cop." Pt reports that he was station in [place] 2 years, and [place]. Pt states that he is recently separated from his wife, and he currently lives with his grandmother, unemployed. Pt states that his anxiety  has gotten to the point that he barely leaves the home, his sleep routine is inconsistent, and he usually will only go out at night. Pt states that he smokes THC daily, vapes constantly, and will drink ETOH when he is unable to smoke. Pt's last ETOH use was x2 days ago, and pt last THC use was before being admitted tonight. Pt states that he has panic attacks a lot. Pt currently has no outpt provider, and no home meds. Pt reports that he sees "random girls" and has no family to talk to.On evaluation patient is alert and oriented x 4, pleasant, and cooperative. Speech is clear and coherent. Mood is depressed and affect is congruent with mood. Thought process is coherent and thought content is logical. Denies suicidal ideations. Denies homicidal ideations. Denies audiovisual hallucinations. No indication that patient is responding to internal stimuli.     Associated Signs/Symptoms: Depression Symptoms:  depressed mood, anhedonia, insomnia, psychomotor agitation, feelings of worthlessness/guilt, difficulty concentrating, hopelessness, suicidal thoughts without plan, anxiety, panic attacks, (Hypo) Manic Symptoms:  Distractibility, Elevated Mood, Impulsivity, Irritable Mood, Anxiety Symptoms:  Excessive Worry, Psychotic Symptoms:  Denies PTSD Symptoms: Denies traumatic exposure Total Time spent with patient: 30 minutes  Past Psychiatric History: MDD. 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 States that he has tried several medications, but the only name he  remembers is trazodone, which he states was helpful for sleep.  Is the patient at risk to self? Yes.    Has the patient been a risk to self in the past 6 months? Yes.    Has the patient been a risk to self within the distant past? Yes.    Is the patient a risk to others? No.  Has the patient been a risk to others in the past 6 months? No.  Has the patient  been a risk to others within the distant past? No.   Prior Inpatient Therapy:   Prior Outpatient Therapy:    Alcohol Screening: 1. How often do you have a drink containing alcohol?: 4 or more times a week 2. How many drinks containing alcohol do you have on a typical day when you are drinking?: 5 or 6 3. How often do you have six or more drinks on one occasion?: Less than monthly AUDIT-C Score: 7 4. How often during the last year have you found that you were not able to stop drinking once you had started?: Never 5. How often during the last year have you failed to do what was normally expected from you becasue of drinking?: Never 6. How often during the last year have you needed a first drink in the morning to get yourself going after a heavy drinking session?: Never 7. How often during the last year have you had a feeling of guilt of remorse after drinking?: Never 8. How often during the last year have you been unable to remember what happened the night before because you had been drinking?: Never 9. Have you or someone else been injured as a result of your drinking?: No 10. Has a relative or friend or a doctor or another health worker been concerned about your drinking or suggested you cut down?: No Alcohol Use Disorder Identification Test Final Score (AUDIT): 7 Alcohol Brief Interventions/Follow-up: AUDIT Score <7 follow-up not indicated Substance Abuse History in the last 12 months:  Yes.   Consequences of Substance Abuse: Negative Previous Psychotropic Medications: Yes  Psychological Evaluations: Yes  Past Medical History:  Past Medical History:  Diagnosis Date  . Anxiety   . Depression    History reviewed. No pertinent surgical history. Family History: History reviewed. No pertinent family history. Family Psychiatric History: No pertinent history Tobacco Screening: Have you used any form of tobacco in the last 30 days? (Cigarettes, Smokeless Tobacco, Cigars, and/or Pipes):  No Social History:  Social History   Substance and Sexual Activity  Alcohol Use Yes   Comment: drinks when THC not available     Social History   Substance and Sexual Activity  Drug Use Yes  . Types: Marijuana   Comment: everyday    Additional Social History:      Pain Medications: pt denies                    Allergies:  No Known Allergies Lab Results: No results found for this or any previous visit (from the past 48 hour(s)).  Blood Alcohol level:  Lab Results  Component Value Date   [facility] <11 09/22/2014    Metabolic Disorder Labs:  No results found for: HGBA1C, MPG No results found for: PROLACTIN No results found for: CHOL, TRIG, HDL, CHOLHDL, VLDL, LDLCALC  Current Medications: Current Facility-Administered Medications  Medication Dose Route Frequency Provider Last Rate Last Dose  . acetaminophen (TYLENOL) tablet 650 mg  650 mg Oral Q6H PRN Jackelyn Poling,  NP      . alum & mag hydroxide-simeth (MAALOX/MYLANTA) 200-200-20 MG/5ML suspension 30 mL  30 mL Oral Q4H PRN Lindon Romp A, NP      . hydrOXYzine (ATARAX/VISTARIL) tablet 25 mg  25 mg Oral TID PRN Lindon Romp A, NP      . magnesium hydroxide (MILK OF MAGNESIA) suspension 30 mL  30 mL Oral Daily PRN Lindon Romp A, NP      . nicotine polacrilex (NICORETTE) gum 2 mg  2 mg Oral PRN Lindon Romp A, NP   2 mg at 09/28/19 0133  . traZODone (DESYREL) tablet 50 mg  50 mg Oral QHS PRN Lindon Romp A, NP       PTA Medications: No medications prior to admission.    Musculoskeletal: Strength & Muscle Tone: within normal limits Gait & Station: normal Patient leans: N/A  Psychiatric Specialty Exam: Physical Exam  Constitutional: He is oriented to person, place, and time. He appears well-developed and well-nourished. No distress.  HENT:  Head: Normocephalic and atraumatic.  Right Ear: External ear normal.  Left Ear: External ear normal.  Eyes: Pupils are equal, round, and reactive to light. Right eye  exhibits no discharge. Left eye exhibits no discharge. No scleral icterus.  Respiratory: No respiratory distress.  Musculoskeletal: Normal range of motion.  Neurological: He is alert and oriented to person, place, and time.  Skin: He is not diaphoretic.  Psychiatric: His mood appears anxious. He is not withdrawn and not actively hallucinating. Thought content is not paranoid and not delusional. He expresses impulsivity and inappropriate judgment. He exhibits a depressed mood. He expresses suicidal ideation. He expresses no homicidal ideation. He expresses no suicidal plans.    Review of Systems  Constitutional: Negative for chills, diaphoresis, fever, malaise/fatigue and weight loss.  Respiratory: Negative for cough and shortness of breath.   Cardiovascular: Negative for chest pain.  Gastrointestinal: Negative for diarrhea, nausea and vomiting.  Neurological: Negative for dizziness and headaches.  Psychiatric/Behavioral: Positive for depression and suicidal ideas. Negative for hallucinations, memory loss and substance abuse. The patient is nervous/anxious and has insomnia.     There were no vitals taken for this visit.There is no height or weight on file to calculate BMI.  General Appearance: Casual and Fairly Groomed  Eye Contact:  Fair  Speech:  Clear and Coherent and Normal Rate  Volume:  Normal  Mood:  Anxious, Depressed, Hopeless, Irritable and Worthless  Affect:  Congruent and Restricted  Thought Process:  Coherent, Linear and Descriptions of Associations: Intact  Orientation:  Full (Time, Place, and Person)  Thought Content:  Denies AVH, paranoia, no indication of delusional thinking  Suicidal Thoughts:  reports that he thinks of different plans  Homicidal Thoughts:  No  Memory:  Immediate;   Good  Judgement:  Intact  Insight:  Lacking  Psychomotor Activity:  Restlessness  Concentration:  Concentration: Poor  Recall:  [place] of Knowledge:  Good  Language:  Good   Akathisia:  Negative  Handed:  Right  AIMS (if indicated):     Assets:  Communication Skills Desire for Improvement Housing Leisure Time Physical Health  ADL's:  Intact  Cognition:  WNL  Sleep:         Treatment Plan Summary: Daily contact with patient to assess and evaluate symptoms and progress in treatment and Medication management  Observation Level/Precautions:  15 minute checks Laboratory:  CBC Chemistry Profile UDS Psychotherapy:  Individual Medications:   Hydroxyzine 25 mg TID prn anxiety  Trazodone 50 mg QHS prn sleep Consultations:  As needed Discharge Concerns:  safety Estimated LOS: Other:      Jackelyn Poling, NP 11/10/20204:08 AM

## 2019-09-28 NOTE — BH Assessment (Signed)
[facility] Assessment Progress Note  Per Shuvon Rankin, FNP, this voluntary pt requires psychiatric hospitalization.  Placement will be sought for pt.  Pt's nurse, Nicoletta Dress, has been notified.  Jalene Mullet, [place] Coordinator [phone]

## 2019-09-28 NOTE — H&P (Signed)
Behavioral Health Medical Screening Exam  Keith Parks is an 23 y.o. male.  Psychiatric Specialty Exam: Physical Exam  Constitutional: He is oriented to person, place, and time. He appears well-developed and well-nourished. No distress.  HENT:  Head: Normocephalic and atraumatic.  Right Ear: External ear normal.  Left Ear: External ear normal.  Eyes: Pupils are equal, round, and reactive to light. Right eye exhibits no discharge. Left eye exhibits no discharge. No scleral icterus.  Respiratory: No respiratory distress.  Musculoskeletal: Normal range of motion.  Neurological: He is alert and oriented to person, place, and time.  Skin: He is not diaphoretic.  Psychiatric: His mood appears anxious. He is not withdrawn and not actively hallucinating. Thought content is not paranoid and not delusional. He expresses impulsivity and inappropriate judgment. He exhibits a depressed mood. He expresses suicidal ideation. He expresses no homicidal ideation. He expresses no suicidal plans.    Review of Systems  Constitutional: Negative for chills, diaphoresis, fever, malaise/fatigue and weight loss.  Respiratory: Negative for cough and shortness of breath.   Cardiovascular: Negative for chest pain.  Gastrointestinal: Negative for diarrhea, nausea and vomiting.  Neurological: Negative for dizziness and headaches.  Psychiatric/Behavioral: Positive for depression and suicidal ideas. Negative for hallucinations, memory loss and substance abuse. The patient is nervous/anxious and has insomnia.     There were no vitals taken for this visit.There is no height or weight on file to calculate BMI.  General Appearance: Casual and Fairly Groomed  Eye Contact:  Fair  Speech:  Clear and Coherent and Normal Rate  Volume:  Normal  Mood:  Anxious, Depressed, Hopeless, Irritable and Worthless  Affect:  Congruent and Restricted  Thought Process:  Coherent, Linear and Descriptions of Associations: Intact   Orientation:  Full (Time, Place, and Person)  Thought Content:  Denies AVH, paranoia, no indication of delusional thinking  Suicidal Thoughts:  reports that he thinks of different plans  Homicidal Thoughts:  No  Memory:  Immediate;   Good  Judgement:  Intact  Insight:  Lacking  Psychomotor Activity:  Restlessness  Concentration:  Concentration: Poor  Recall:  [place] of Knowledge:  Good  Language:  Good  Akathisia:  Negative  Handed:  Right  AIMS (if indicated):     Assets:  Communication Skills Desire for Improvement Housing Leisure Time Physical Health  ADL's:  Intact  Cognition:  WNL  Sleep:        There were no vitals taken for this visit.  Recommendations:  Based on my evaluation the patient does not appear to have an emergency medical condition.  Rozetta Nunnery, NP 09/28/2019, 4:22 AM

## 2019-09-28 NOTE — Progress Notes (Signed)
[facility] Observation Crisis Plan  Reason for Crisis Plan:  Crisis Stabilization   Plan of Care:  Referral for Inpatient Hospitalization  Family Support:      Current Living Environment:     Insurance:   Hospital Account    Name Acct ID Class Status Primary Coverage   Keith Parks, Keith Parks [id number] [place] Open None        Guarantor Account (for Hospital Account [id number])    Name Relation to Pt Service Area Active? Acct Type   Keith Parks A Self [facility] Yes Behavioral Health   Address Phone       [address], [place] [address] [phone])          Coverage Information (for Hospital Account [id number])    Not on file      Legal Guardian:     Primary Care Provider:  Patient, No Pcp Per  Current Outpatient Providers:  Dr Keith Parks  Psychiatrist:     Counselor/Therapist:     Compliant with Medications:  No  Additional Information:   [place], [place] 11/10/20202:48 AM

## 2019-09-28 NOTE — BH Assessment (Signed)
[facility] Assessment Progress Note   Pt meets inpatient care criteria.  There are no appropriate beds on the adult unit.  Patient admitted to OBS bed 207.  TTS to seek placement.

## 2019-09-29 LAB — RAPID URINE DRUG SCREEN, HOSP PERFORMED
Amphetamines: NOT DETECTED
Barbiturates: NOT DETECTED
Benzodiazepines: NOT DETECTED
Cocaine: NOT DETECTED
Opiates: NOT DETECTED
Tetrahydrocannabinol: POSITIVE — AB

## 2019-09-29 LAB — URINALYSIS, ROUTINE W REFLEX MICROSCOPIC
Bilirubin Urine: NEGATIVE
Glucose, UA: NEGATIVE mg/dL
Hgb urine dipstick: NEGATIVE
Ketones, ur: NEGATIVE mg/dL
Nitrite: NEGATIVE
Protein, ur: 30 mg/dL — AB
Specific Gravity, Urine: 1.019 (ref 1.005–1.030)
pH: 6 (ref 5.0–8.0)

## 2019-09-29 MED ORDER — TRAZODONE HCL 50 MG PO TABS: 50.0000 mg | ORAL_TABLET | Freq: Every evening | ORAL | 0 refills | Status: DC | PRN

## 2019-09-29 MED ORDER — HYDROXYZINE HCL 25 MG PO TABS: 25.0000 mg | ORAL_TABLET | Freq: Three times a day (TID) | ORAL | 0 refills | Status: DC | PRN

## 2019-09-29 MED ORDER — NICOTINE POLACRILEX 2 MG MT GUM: 2.0000 mg | CHEWING_GUM | OROMUCOSAL | 0 refills | Status: DC | PRN

## 2019-09-29 MED ORDER — SERTRALINE HCL 25 MG PO TABS: 25.0000 mg | ORAL_TABLET | Freq: Every day | ORAL | 0 refills | Status: DC

## 2019-09-29 NOTE — BH Assessment (Signed)
[facility] Assessment Progress Note  Per Neita Garnet, MD, this pt does not require psychiatric hospitalization at this time.  Pt is to be discharged from the [facility] Observation Unit with outpatient referrals.  It is uncertain whether pt is eligible for veterans benefits.  Discharge instructions advise pt to follow up with [facility], but also include contact information for VA service providers if he is eligible.  Pt's nurse, Louanne Skye, has been notified.  Jalene Mullet, [place] Triage Specialist [phone]

## 2019-09-29 NOTE — Progress Notes (Signed)
Patient in room awake. Alert and oriented and denying thoughts of self harm. Thought process organized. Had breakfast and morning medications. To be evaluated by attending MD.

## 2019-09-29 NOTE — Discharge Instructions (Signed)
For your behavioral health needs you are advised to follow up with [facility].  New patients are seen at their walk-in clinic.  Walk-in hours are Monday - Friday from 8:30 am - 12:00 pm, and from 1:00 pm - 2:30 pm.  Walk-in patients are seen on a first come, first served basis, so try to arrive as early as possible for the best chance of being seen the same day:       [facility] [place], [place] [address]      [phone]  If you are eligible for Veterans benefits, you can follow up with the [facility] as an alternative:       [facility]      [place], [place] [address]      [phone]  For other supportive services for veterans in the [place] area, contact the [facility]:       [facility]      [address], [place], [place] [address]      [phone]      Hours of operation: 8:00 am - 7:00 pm, Monday - Friday  For crisis services for veterans, call the [employer], available 24 hours a day, 7 days a week:       [employer]      [phone]

## 2019-09-29 NOTE — Progress Notes (Signed)
Patient discharged to his mother's house. Prescriptions and follow up information provided. No sign of distress upon discharge.

## 2019-09-29 NOTE — Discharge Summary (Addendum)
[facility] Psych ED Discharge  09/29/2019 12:45 PM Keith Parks   MRN:  [id number]  Principal Problem: Generalized anxiety disorder with panic attacks  Discharge Diagnoses: Principal Problem:   Generalized anxiety disorder with panic attacks Active Problems:   Suicidal ideation   Severe recurrent major depression without psychotic features ([place])  Per Admission evaluation: Keith Parks an 23 y.o.male.Patient asked his father to bring him in to [facility]. Pt says he has increased depression and anxiety. Having suicidal thoughts.Patient is suicidal w/ plan ot do "suicide by cop" or overdose. Patient is evasive when asked if he has access to guns he says "I would rather not answer that." It must be presumed that he does. Pt denies any previous suicide attempts. Pt has no HI at this time. He denies any A/V hallucinations. Patient says he smokes marijuana daily. He reports also that he will drink when he cannot get marijuana. His last use of ETOH was on 11/08. Pt says he used marijuana prior to arrival. Patient reports having anxiety to the point that he can barely go out to the store or do any other functions in public. Patient says that "it is affecting most of my life." Patient came back to [place] from [place]. He had been in the [employer]. It is not clear when he moved back to [place]. Patient says that he and his wife are separated now also and he is currently unemployed.  Subjective: During the follow-up care assessment this am, Keith Parks reports, "I'm doing better, just irritated for being here. I'm ready to be discharged. I came here because I was having panic attacks that was getting worse & worse, as a result, suicidal thoughts crossed my mind. I'm not feeling suicidal any more. I'm getting angry & irritated because I'm here. I was a patientt in this hospital in 2015, it was not a good experience for me then. I will prefer outpatient clinic like the Partial hospital program. The only thing going  on with me now is, I have this pain to my left jaw that bothers me when I eat. I'm not drinking alcohol or abusing drugs. I live with my grand-mother & I will be going back there after discharge. I lost my job during the [employer] pandemic. I will also try to connect with the [facility] for treatment". Patient currently denies any SIHI, AVH, delusional thoughts or paranoia. He does not appear to be responding to any internal stimuli. A collateral information from patient's grand-mother Keith Parks obtained by the attending psychiatrist indicated that she is aware & in agreement with patient getting discharged from the [facility] observation unit today to pursue mental health care & medication management on an outpatient basis. She expressed no concerns about patient's safety or wellbeing after discharge. Patient was provided with prescriptions on Sertraline 25 mg for depression, Trazodone 50 mg prn for insomnia & Vistaril 25 mg prn for anxiety. He received from the [facility], 7 days worth supply samples of his [facility] discharge medications. Transportation per patient's arrangement.  Total Time spent with patient: 30 minutes  Past Psychiatric History: GAD, Major depressive disorder.  Past Medical History:  Past Medical History:  Diagnosis Date  . Anxiety   . Depression    History reviewed. No pertinent surgical history.  Family History: History reviewed. No pertinent family history.  Family Psychiatric  History: See H&P  Social History:  Social History   Substance and Sexual Activity  Alcohol Use Yes   Comment: drinks when THC not available  Social History   Substance and Sexual Activity  Drug Use Yes  . Types: Marijuana   Comment: everyday    Social History   Socioeconomic History  . Marital status: Legally Separated    Spouse name: Not on file  . Number of children: Not on file  . Years of education: Not on file  . Highest education level: Not on file  Occupational History  . Not on file   Social Needs  . Financial resource strain: Not on file  . Food insecurity    Worry: Not on file    Inability: Not on file  . Transportation needs    Medical: Not on file    Non-medical: Not on file  Tobacco Use  . Smoking status: Never Smoker  . Smokeless tobacco: Never Used  Substance and Sexual Activity  . Alcohol use: Yes    Comment: drinks when THC not available  . Drug use: Yes    Types: Marijuana    Comment: everyday  . Sexual activity: Yes    Birth control/protection: Condom  Lifestyle  . Physical activity    Days per week: Not on file    Minutes per session: Not on file  . Stress: Not on file  Relationships  . Social Musicianconnections    Talks on phone: Not on file    Gets together: Not on file    Attends religious service: Not on file    Active member of club or organization: Not on file    Attends meetings of clubs or organizations: Not on file    Relationship status: Not on file  Other Topics Concern  . Not on file  Social History Narrative  . Not on file   Has this patient used any form of tobacco in the last 30 days? (Cigarettes, Smokeless Tobacco, Cigars, and/or Pipes): An FDA-approved tobacco cessation medication was offered at discharge.  Current Medications: Current Facility-Administered Medications  Medication Dose Route Frequency Provider Last Rate Last Dose  . acetaminophen (TYLENOL) tablet 650 mg  650 mg Oral Q6H PRN Jackelyn PolingBerry, Jason A, NP      . alum & mag hydroxide-simeth (MAALOX/MYLANTA) 200-200-20 MG/5ML suspension 30 mL  30 mL Oral Q4H PRN Nira ConnBerry, Jason A, NP      . hydrOXYzine (ATARAX/VISTARIL) tablet 25 mg  25 mg Oral TID PRN Nira ConnBerry, Jason A, NP      . magnesium hydroxide (MILK OF MAGNESIA) suspension 30 mL  30 mL Oral Daily PRN Nira ConnBerry, Jason A, NP      . nicotine polacrilex (NICORETTE) gum 2 mg  2 mg Oral PRN Nira ConnBerry, Jason A, NP   2 mg at 09/28/19 2133  . sertraline (ZOLOFT) tablet 25 mg  25 mg Oral Daily Rankin, Shuvon B, NP   25 mg at 09/29/19 0857   . traZODone (DESYREL) tablet 50 mg  50 mg Oral QHS PRN Nira ConnBerry, Jason A, NP   50 mg at 09/28/19 2133   PTA Medications: No medications prior to admission.   Musculoskeletal: Strength & Muscle Tone: within normal limits Gait & Station: normal Patient leans: N/A  Psychiatric Specialty Exam: Physical Exam  Nursing note and vitals reviewed. Constitutional: He is oriented to person, place, and time. He appears well-developed.  Neck: Normal range of motion.  Cardiovascular:  Elevated BP: 147/96 Elevated pulse rate: 112. Patient at this time declined to take any antihypertensive agent at this time. Says, this because of the anxiety symptoms. He is however in no apparent distress.  Respiratory:  No respiratory distress. He has no wheezes.  Genitourinary:    Genitourinary Comments: Deferred   Musculoskeletal: Normal range of motion.  Neurological: He is alert and oriented to person, place, and time.  Skin: Skin is warm and dry.    Review of Systems  Constitutional: Negative for chills and fever.  Eyes: Negative for double vision.  Respiratory: Negative for cough, shortness of breath and wheezing.   Cardiovascular: Negative for chest pain and palpitations.  Gastrointestinal: Negative for abdominal pain, heartburn, nausea and vomiting.  Musculoskeletal: Negative for myalgias.  Skin: Negative.   Neurological: Negative for dizziness and headaches.  Psychiatric/Behavioral: Positive for substance abuse (Hx. THC use disorder). Negative for depression (Stable), hallucinations, memory loss and suicidal ideas. The patient is not nervous/anxious (Stable) and does not have insomnia (Stable).     Blood pressure (!) 147/96, pulse (!) 112.There is no height or weight on file to calculate BMI.  General Appearance: Casual and Fairly Groomed  Eye Contact:  Good  Speech:  Clear and Coherent and Normal Rate  Volume:  Normal  Mood:  Euthymic  Affect:  Appropriate and Congruent  Thought Process:   Coherent, Linear and Descriptions of Associations: Intact  Orientation:  Full (Time, Place, and Person)  Thought Content:  Logical, denies any hallucinations, delusions or paranoia  Suicidal Thoughts:  Currently denies any thoughts, plans or intent  Homicidal Thoughts:  Denies  Memory:  Immediate;   Good Recent;   Good Remote;   Good  Judgement:  Intact  Insight:  Good  Psychomotor Activity:  Normal  Concentration:  Concentration: Good and Attention Span: Good  Recall:  Good  Fund of Knowledge:  Good  Language:  Good  Akathisia:  NA  Handed:  Right  AIMS (if indicated):     Assets:  Communication Skills Desire for Improvement Physical Health Social Support  ADL's:  Intact  Cognition:  WNL  Sleep: "I slept well with Trazodone".       Demographic Factors:  Male, Adolescent or young adult, Caucasian, Unemployed and [employer] veteran  Loss Factors: Financial problems/change in socioeconomic status  Historical Factors: NA  Risk Reduction Factors:   Living with another person, especially a relative and Positive social support  Continued Clinical Symptoms:  Severe Anxiety and/or Agitation Panic Attacks Depression:   Insomnia  Cognitive Features That Contribute To Risk:  None    Suicide Risk:  Minimal: No identifiable suicidal ideation.  Patients presenting with no risk factors but with morbid ruminations; may be classified as minimal risk based on the severity of the depressive symptoms  Plan Of Care/Follow-up recommendations: 1. Patient has asked for a referral to the [facility] Partial hospitalization treatment program for counseling sessions & medication management.   2. [facility] also [facility] & medication management with them.  Disposition: Patient is being discharged to his place of resident with his grand-mother Keith Parks. Keith Parks was also contacted prior to patient's discharge & she is agreement for  this discharge & the would be outpatient psychiatric follow-up care referral after discharge. She expressed no concerns about patient's safety or wellbeing after discharge.  Armandina Stammer, NP, PMHNP, FNP-BC. 09/29/2019, 12:45 PM  Attest to NP note

## 2019-10-10 ENCOUNTER — Inpatient Hospital Stay (HOSPITAL_COMMUNITY)
Admission: EM | Admit: 2019-10-10 | Discharge: 2019-10-12 | DRG: 315 | Disposition: A | Discharge status: Home / Self Care | Payer: Self-pay | Attending: Cardiovascular Disease | Admitting: Cardiovascular Disease

## 2019-10-10 ENCOUNTER — Inpatient Hospital Stay: Admit: 2019-10-10 | Payer: Self-pay | Admitting: Cardiovascular Disease

## 2019-10-10 ENCOUNTER — Encounter (HOSPITAL_COMMUNITY): Payer: Self-pay

## 2019-10-10 ENCOUNTER — Other Ambulatory Visit: Payer: Self-pay

## 2019-10-10 ENCOUNTER — Emergency Department (HOSPITAL_COMMUNITY): Payer: Self-pay

## 2019-10-10 DIAGNOSIS — F410 Panic disorder [episodic paroxysmal anxiety] without agoraphobia: Secondary | ICD-10-CM | POA: Diagnosis present

## 2019-10-10 DIAGNOSIS — Z20828 Contact with and (suspected) exposure to other viral communicable diseases: Secondary | ICD-10-CM | POA: Diagnosis present

## 2019-10-10 DIAGNOSIS — F411 Generalized anxiety disorder: Secondary | ICD-10-CM | POA: Diagnosis present

## 2019-10-10 DIAGNOSIS — F330 Major depressive disorder, recurrent, mild: Secondary | ICD-10-CM | POA: Diagnosis present

## 2019-10-10 DIAGNOSIS — Z79899 Other long term (current) drug therapy: Secondary | ICD-10-CM

## 2019-10-10 DIAGNOSIS — R7989 Other specified abnormal findings of blood chemistry: Secondary | ICD-10-CM

## 2019-10-10 DIAGNOSIS — I319 Disease of pericardium, unspecified: Secondary | ICD-10-CM

## 2019-10-10 DIAGNOSIS — I309 Acute pericarditis, unspecified: Principal | ICD-10-CM | POA: Diagnosis present

## 2019-10-10 DIAGNOSIS — G4700 Insomnia, unspecified: Secondary | ICD-10-CM | POA: Diagnosis present

## 2019-10-10 DIAGNOSIS — S2690XA Unspecified injury of heart, unspecified with or without hemopericardium, initial encounter: Secondary | ICD-10-CM

## 2019-10-10 DIAGNOSIS — IMO0001 Reserved for inherently not codable concepts without codable children: Secondary | ICD-10-CM

## 2019-10-10 LAB — CBC
HCT: 45.5 % (ref 39.0–52.0)
Hemoglobin: 15.7 g/dL (ref 13.0–17.0)
MCH: 30.9 pg (ref 26.0–34.0)
MCHC: 34.5 g/dL (ref 30.0–36.0)
MCV: 89.6 fL (ref 80.0–100.0)
Platelets: 202 10*3/uL (ref 150–400)
RBC: 5.08 MIL/uL (ref 4.22–5.81)
RDW: 11.9 % (ref 11.5–15.5)
WBC: 7.2 10*3/uL (ref 4.0–10.5)
nRBC: 0 % (ref 0.0–0.2)

## 2019-10-10 LAB — HEPARIN LEVEL (UNFRACTIONATED): Heparin Unfractionated: <0.1 IU/mL — ABNORMAL LOW (ref 0.30–0.70)

## 2019-10-10 LAB — RAPID URINE DRUG SCREEN, HOSP PERFORMED
Amphetamines: NOT DETECTED
Barbiturates: NOT DETECTED
Benzodiazepines: NOT DETECTED
Cocaine: NOT DETECTED
Opiates: NOT DETECTED
Tetrahydrocannabinol: POSITIVE — AB

## 2019-10-10 LAB — LIPID PANEL
Cholesterol: 122 mg/dL (ref 0–200)
HDL: 51 mg/dL (ref >40)
LDL Cholesterol: 52 mg/dL (ref 0–99)
Total CHOL/HDL Ratio: 2.4 RATIO
Triglycerides: 96 mg/dL (ref <150)
VLDL: 19 mg/dL (ref 0–40)

## 2019-10-10 LAB — BASIC METABOLIC PANEL
Anion gap: 7 (ref 5–15)
BUN: 8 mg/dL (ref 6–20)
CO2: 27 mmol/L (ref 22–32)
Calcium: 9.2 mg/dL (ref 8.9–10.3)
Chloride: 102 mmol/L (ref 98–111)
Creatinine, Ser: 0.83 mg/dL (ref 0.61–1.24)
GFR calc Af Amer: >60 mL/min (ref >60)
GFR calc non Af Amer: >60 mL/min (ref >60)
Glucose, Bld: 106 mg/dL — ABNORMAL HIGH (ref 70–99)
Potassium: 3.8 mmol/L (ref 3.5–5.1)
Sodium: 136 mmol/L (ref 135–145)

## 2019-10-10 LAB — TROPONIN I (HIGH SENSITIVITY)
Troponin I (High Sensitivity): 5991 ng/L (ref <18)
Troponin I (High Sensitivity): 8040 ng/L (ref <18)
Troponin I (High Sensitivity): 6052 ng/L (ref <18)

## 2019-10-10 LAB — CBG MONITORING, ED: Glucose-Capillary: 106 mg/dL — ABNORMAL HIGH (ref 70–99)

## 2019-10-10 LAB — POC SARS CORONAVIRUS 2 AG -  ED: SARS Coronavirus 2 Ag: NEGATIVE

## 2019-10-10 MED ORDER — ATORVASTATIN CALCIUM 40 MG PO TABS
40.0000 mg | ORAL_TABLET | Freq: Every day | ORAL | Status: DC
  Filled 2019-10-10: qty 1

## 2019-10-10 MED ORDER — HEPARIN BOLUS VIA INFUSION
4000.0000 [IU] | Freq: Once | INTRAVENOUS | Status: AC
  Administered 2019-10-10: 4000 [IU] via INTRAVENOUS

## 2019-10-10 MED ORDER — HEPARIN (PORCINE) 25000 UT/250ML-% IV SOLN
1300.0000 [IU]/h | INTRAVENOUS | Status: DC
  Administered 2019-10-10: 900 [IU]/h via INTRAVENOUS
  Administered 2019-10-11: 1300 [IU]/h via INTRAVENOUS
  Filled 2019-10-10 (×2): qty 250

## 2019-10-10 MED ORDER — TRAZODONE HCL 100 MG PO TABS
100.0000 mg | ORAL_TABLET | Freq: Every day | ORAL | Status: DC
  Administered 2019-10-10: 100 mg via ORAL
  Filled 2019-10-10: qty 1

## 2019-10-10 MED ORDER — SERTRALINE HCL 50 MG PO TABS
25.0000 mg | ORAL_TABLET | Freq: Every day | ORAL | Status: DC
  Administered 2019-10-11: 25 mg via ORAL
  Filled 2019-10-10: qty 1

## 2019-10-10 MED ORDER — HEPARIN BOLUS VIA INFUSION
2100.0000 [IU] | Freq: Once | INTRAVENOUS | Status: AC
  Administered 2019-10-10: 2100 [IU] via INTRAVENOUS
  Filled 2019-10-10: qty 2100

## 2019-10-10 MED ORDER — ACETAMINOPHEN 325 MG PO TABS: 650.0000 mg | ORAL_TABLET | ORAL | Status: DC | PRN

## 2019-10-10 MED ORDER — METOPROLOL TARTRATE 12.5 MG HALF TABLET
12.5000 mg | ORAL_TABLET | Freq: Two times a day (BID) | ORAL | Status: DC
  Administered 2019-10-10 – 2019-10-12 (×4): 12.5 mg via ORAL
  Filled 2019-10-10 (×4): qty 1

## 2019-10-10 MED ORDER — ENSURE ENLIVE PO LIQD: 237.0000 mL | Freq: Two times a day (BID) | ORAL | Status: DC

## 2019-10-10 MED ORDER — ONDANSETRON HCL 4 MG/2ML IJ SOLN: 4.0000 mg | Freq: Four times a day (QID) | INTRAMUSCULAR | Status: DC | PRN

## 2019-10-10 MED ORDER — ASPIRIN EC 81 MG PO TBEC
81.0000 mg | DELAYED_RELEASE_TABLET | Freq: Every day | ORAL | Status: DC
  Administered 2019-10-11: 81 mg via ORAL
  Filled 2019-10-10: qty 1

## 2019-10-10 MED ORDER — HYDROXYZINE HCL 25 MG PO TABS
25.0000 mg | ORAL_TABLET | Freq: Three times a day (TID) | ORAL | Status: DC | PRN
  Administered 2019-10-11: 25 mg via ORAL
  Filled 2019-10-10: qty 1

## 2019-10-10 MED ORDER — ASPIRIN 81 MG PO CHEW
324.0000 mg | CHEWABLE_TABLET | Freq: Once | ORAL | Status: AC
  Administered 2019-10-10: 324 mg via ORAL
  Filled 2019-10-10: qty 4

## 2019-10-10 NOTE — Progress Notes (Signed)
ANTICOAGULATION CONSULT NOTE - Initial Consult  Pharmacy Consult for heparin Indication: chest pain/ACS  No Known Allergies  Patient Measurements: Height: 5\' 11"  (180.3 cm) Weight: 162 lb (73.5 kg) IBW/kg (Calculated) : 75.3 Heparin Dosing Weight: 73 kg  Vital Signs: Temp: 99.5 F (37.5 C) (11/22 1053) BP: 131/89 (11/22 1053) Pulse Rate: 98 (11/22 1053)  Labs: Recent Labs    10/10/19 1122  HGB 15.7  HCT 45.5  PLT 202  CREATININE 0.83  TROPONINIHS 5,991*    Estimated Creatinine Clearance: 142.7 mL/min (by C-G formula based on SCr of 0.83 mg/dL).   Medical History: Past Medical History:  Diagnosis Date  . Anxiety   . Depression     Medications:  (Not in a hospital admission)   Assessment: Pharmacy consulted to dose heparin in patient with chest pain/ACS. Patient is not on anticoagulation prior to admission.  Goal of Therapy:  Heparin level 0.3-0.7 units/ml Monitor platelets by anticoagulation protocol: Yes   Plan:  Give 4000 units bolus x 1 Start heparin infusion at 900 units/hr Check anti-Xa level in 6 hours and daily while on heparin Continue to monitor H&H and platelets  Ramond Craver 10/10/2019,12:32 PM

## 2019-10-10 NOTE — ED Notes (Signed)
Date and time results received: 10/10/19 2:15 PM (use smartphrase ".now" to insert current time)  Test: Troponin Critical Value: 8040  Name of Provider Notified: Tomi Bamberger  Orders Received? Or Actions Taken?: Orders Received - See Orders for details

## 2019-10-10 NOTE — ED Notes (Signed)
Date and time results received: 10/10/19 1224 (use smartphrase ".now" to insert current time)  Test: troponin Critical Value: [place]  Name of Provider Notified: knapp,md  Orders Received? Or Actions Taken?:

## 2019-10-10 NOTE — ED Notes (Signed)
Report given to Calvin with [facility]  ?

## 2019-10-10 NOTE — ED Provider Notes (Signed)
[facility] EMERGENCY DEPARTMENT Provider Note   CSN: [id number] Arrival date & time: 10/10/19  1042     History   Chief Complaint Chief Complaint  Patient presents with  . Chest Pain    HPI Keith Parks is a 23 y.o. male.  HPI: A 23 year old patient presents for evaluation of chest pain. Initial onset of pain was less than one hour ago. The patient's chest pain is worse with exertion. The patient's chest pain is middle- or left-sided, is not well-localized, is not described as heaviness/pressure/tightness, is not sharp and does not radiate to the arms/jaw/neck. The patient does not complain of nausea and denies diaphoresis. The patient has smoked in the past 90 days. The patient has no history of stroke, has no history of peripheral artery disease, denies any history of treated diabetes, has no relevant family history of coronary artery disease (first degree relative at less than age 24), is not hypertensive, has no history of hypercholesterolemia and does not have an elevated BMI (>=30).   HPI Patient presents emergency room for evaluation of chest pain.  Patient states the symptoms started yesterday.  The episodes have been coming and going.  He cannot describe the quality of the pain, he states it just hurts.  Pain has been on the right side that moves towards the left side of his chest.  He denies any shortness of breath or nausea but has noticed some palpitations.  He denies any fevers chills or cough.  Patient denies any history of heart disease.  No family history of heart disease.  Patient states nobody is lying down in the ED the pain has resolved. Past Medical History:  Diagnosis Date  . Anxiety   . Depression     Patient Active Problem List   Diagnosis Date Noted  . Severe recurrent major depression without psychotic features (HCC) 09/28/2019  . Generalized anxiety disorder with panic attacks 09/28/2019  . Major depressive disorder, recurrent episode, moderate (HCC)    . MDD (major depressive disorder) 09/22/2014  . Major depressive disorder, recurrent, severe without psychotic features (HCC)   . Suicidal ideation     History reviewed. No pertinent surgical history.      Home Medications    Prior to Admission medications   Medication Sig Start Date End Date Taking? Authorizing Provider  hydrOXYzine (ATARAX/VISTARIL) 25 MG tablet Take 1 tablet (25 mg total) by mouth 3 (three) times daily as needed for anxiety. 09/29/19  Yes Armandina StammerNwoko, Agnes I, NP  sertraline (ZOLOFT) 25 MG tablet Take 1 tablet (25 mg total) by mouth daily. For depression 09/30/19  Yes Armandina StammerNwoko, Agnes I, NP  traZODone (DESYREL) 100 MG tablet Take 100 mg by mouth at bedtime.  10/06/19  Yes [provider]  citalopram (CELEXA) 20 MG tablet Take 20 mg by mouth daily. 10/06/19   [provider]  nicotine polacrilex (NICORETTE) 2 MG gum Take 1 each (2 mg total) by mouth as needed for smoking cessation. Patient not taking: Reported on 10/10/2019 09/29/19   Armandina StammerNwoko, Agnes I, NP    Family History No family history on file.  Social History Social History   Tobacco Use  . Smoking status: Never Smoker  . Smokeless tobacco: Never Used  Substance Use Topics  . Alcohol use: Yes    Comment: last etoh was 3 days ago  . Drug use: Yes    Types: Marijuana    Comment: everyday- none in 2 weeks     Allergies   Patient  has no known allergies.   Review of Systems Review of Systems  All other systems reviewed and are negative.    Physical Exam Updated Vital Signs BP 123/87   Pulse 78   Temp 99.5 F (37.5 C)   Resp 16   Ht 1.803 m (5\' 11" )   Wt 73.5 kg   SpO2 97%   BMI 22.59 kg/m   Physical Exam Vitals signs and nursing note reviewed.  Constitutional:      General: He is not in acute distress.    Appearance: He is well-developed.  HENT:     Head: Normocephalic and atraumatic.     Right Ear: External ear normal.     Left Ear: External ear normal.  Eyes:      General: No scleral icterus.       Right eye: No discharge.        Left eye: No discharge.     Conjunctiva/sclera: Conjunctivae normal.  Neck:     Musculoskeletal: Neck supple.     Trachea: No tracheal deviation.  Cardiovascular:     Rate and Rhythm: Normal rate and regular rhythm.  Pulmonary:     Effort: Pulmonary effort is normal. No respiratory distress.     Breath sounds: Normal breath sounds. No stridor. No wheezing or rales.  Abdominal:     General: Bowel sounds are normal. There is no distension.     Palpations: Abdomen is soft.     Tenderness: There is no abdominal tenderness. There is no guarding or rebound.  Musculoskeletal:        General: No tenderness.  Skin:    General: Skin is warm and dry.     Findings: No rash.  Neurological:     Mental Status: He is alert.     Cranial Nerves: No cranial nerve deficit (no facial droop, extraocular movements intact, no slurred speech).     Sensory: No sensory deficit.     Motor: No abnormal muscle tone or seizure activity.     Coordination: Coordination normal.      ED Treatments / Results  Labs (all labs ordered are listed, but only abnormal results are displayed) Labs Reviewed  BASIC METABOLIC PANEL - Abnormal; Notable for the following components:      Result Value   Glucose, Bld 106 (*)    All other components within normal limits  CBG MONITORING, ED - Abnormal; Notable for the following components:   Glucose-Capillary 106 (*)    All other components within normal limits  TROPONIN I (HIGH SENSITIVITY) - Abnormal; Notable for the following components:   Troponin I (High Sensitivity) 5,991 (*)    All other components within normal limits  CBC  RAPID URINE DRUG SCREEN, HOSP PERFORMED  HEPARIN LEVEL (UNFRACTIONATED)  POC SARS CORONAVIRUS 2 AG -  ED  TROPONIN I (HIGH SENSITIVITY)    EKG EKG Interpretation  Date/Time:  Sunday October 10 2019 10:52:11 EST Ventricular Rate:  106 PR Interval:    QRS Duration: 108  QT Interval:  326 QTC Calculation: 433 R Axis:   93 Text Interpretation: Sinus tachycardia st elevation diffusely no prior ecg for comparison Confirmed by Dorie Rank [phone]) on 10/10/2019 10:58:00 AM   EKG Interpretation  Date/Time:  Sunday October 10 2019 12:34:47 EST Ventricular Rate:  86 PR Interval:    QRS Duration: 108 QT Interval:  339 QTC Calculation: 406 R Axis:   88 Text Interpretation: Sinus rhythm RSR' in V1 or V2, probably normal variant Inferior infarct,  acute (LCx) Lateral leads are also involved unchanged from prior ECG Confirmed by Linwood Dibbles [phone]) on 10/10/2019 12:54:19 PM        Radiology Dg Chest Portable 1 View  Result Date: 10/10/2019 CLINICAL DATA:  Chest pain EXAM: PORTABLE CHEST 1 VIEW COMPARISON:  11/21/2009 FINDINGS: 1108 hours. The lungs are clear without focal pneumonia, edema, pneumothorax or pleural effusion. The cardiopericardial silhouette is within normal limits for size. The visualized bony structures of the thorax are intact. Telemetry leads overlie the chest. IMPRESSION: No active disease. Electronically Signed   By: Kennith Center M.D.   On: 10/10/2019 11:48    Procedures .Critical Care Performed by: Linwood Dibbles, MD Authorized by: Linwood Dibbles, MD   Critical care provider statement:    Critical care time (minutes):  45   Critical care was time spent personally by me on the following activities:  Discussions with consultants, evaluation of patient's response to treatment, examination of patient, ordering and performing treatments and interventions, ordering and review of laboratory studies, ordering and review of radiographic studies, pulse oximetry, re-evaluation of patient's condition, obtaining history from patient or surrogate and review of old charts   (including critical care time)  Medications Ordered in ED Medications  heparin ADULT infusion 100 units/mL (25000 units/23mL sodium chloride 0.45%) (900 Units/hr Intravenous New  Bag/Given 10/10/19 1251)  aspirin chewable tablet 324 mg (324 mg Oral Given 10/10/19 1110)  heparin bolus via infusion 4,000 Units (4,000 Units Intravenous Bolus from Bag 10/10/19 1251)     Initial Impression / Assessment and Plan / ED Course  I have reviewed the triage vital signs and the nursing notes.  Pertinent labs & imaging results that were available during my care of the patient were reviewed by me and considered in my medical decision making (see chart for details).  Clinical Course as of Oct 09 1349  Sun Oct 10, 2019  1101 EKG does show some ST elevation, not definitive for acute MI on my review.  ACS, pericarditis, myocarditis is also a consideration.  Patient currently denies any pain.   [JK]  1227 Patient's troponin is significantly elevated at 5991.  I will consult cardiology.   [JK]  1235 Pt still remains pain free.  Reviewed findings with pt and family.  Will start heparin, repeat ECG   [JK]  1259 Discussed with Dr Elease Hashimoto, will get covid   [JK]  1348 Rapid engine Covid test is negative.  Patient will require follow-up testing per protocol.   [JK]  1349 Case discussed with Dr. Lourena Simmonds.  Patient will be admitted down to [facility].  He will evaluate patient.  Most likely will need  coronary CTA versus cardiac catheterization.   [JK]    Clinical Course User Index [JK] Linwood Dibbles, MD    HEAR Score: 3  Patient presented to ED with acute chest pain and abnormal EKG findings.  Patient's age and presentation is atypical for ST elevation MI.  However, he did have ST elevation on EKG, both his initial and subsequent EKG.  Patient remains pain-free in the ED.  Patient has been given aspirin and heparin.  He denies any recent cocaine use but UDS has been added on.  I discussed the case with Dr. Lourena Simmonds cardiology.  Patient will be transferred down to [facility] for further treatment and evaluation.  Final Clinical Impressions(s) / ED Diagnoses   Final  diagnoses:  Myocardial injury, initial encounter      Linwood Dibbles, MD 10/10/19 1351

## 2019-10-10 NOTE — Progress Notes (Signed)
Dr. Kalman Shan, on call Cards paged for orders. He wants another EKG done and will be up to see pt.

## 2019-10-10 NOTE — ED Triage Notes (Signed)
Pt reports admitted to [facility] 2 weeks ago for suicidal ideation.  Reports had some medication changes that have helped his depression and SI but c/o chest pain since yesterday.

## 2019-10-10 NOTE — ED Notes (Signed)
Contacted [facility], Tim for 2 hour covid test and informed pt does not meet criteria for this test. MD made aware.

## 2019-10-10 NOTE — H&P (Signed)
History & Physical    Patient ID: Keith Parks MRN: [id number], DOB/AGE: Aug 20, 1996   Admit date: 10/10/2019  Primary Physician: Patient, No Pcp Per Primary Cardiologist: New   Patient Profile    Keith Parks is a 23 year old male with a history of major depression, generalized anxiety, and tobacco use who presented to [facility] with chest pain which began 1 hour prior to presentation. High-sensitivity troponin found to be markedly elevated therefore plan is for transfer to [facility] for further cardiac evaluation.  History of Present Illness    Keith Parks is a 23 year old male with a history as stated above who presented to [facility] on 10/10/2019 with chest pain that "he has been telling everyone" has been intermittent for a day, but probably has been off and on for 3 days. It is not exertional, pleuritic, or positional. He has very poor recall of any details - doesn't remember how long the episodes last, what they feel like, or how frequently they occur. He reports being sedentary and they just occur seemingly at random. This has been associated with palpitations in that his heart feels like its racing. He denies dyspnea, orthopnea, swelling, or PND. No syncope. Appetite has been poor. No diaphoresis or unintentional weight loss, no tremor he can recall. No drug use other than marijuana, last use reported 2 weeks ago, UDS positive for THC. He does vape THC products as well. Thinks it is very unlikely there is any contamination with other drugs. No recent illnesses.   In the ED, initial ECG showed diffuse ST elevation with associated PR depression and incomplete RBBB. HS-troponin elevated at 5991. Pain free for at least 2 hours. CXR with no acute abnormalities or other active pathology. All other lab work within normal limits. VS wnl, though temperature is 99.7 oral.   Home Medications    Prior to Admission medications   Medication Sig Start Date End Date Taking?  Authorizing Provider  hydrOXYzine (ATARAX/VISTARIL) 25 MG tablet Take 1 tablet (25 mg total) by mouth 3 (three) times daily as needed for anxiety. 09/29/19  Yes Armandina Stammer I, NP  sertraline (ZOLOFT) 25 MG tablet Take 1 tablet (25 mg total) by mouth daily. For depression 09/30/19  Yes Armandina Stammer I, NP  traZODone (DESYREL) 100 MG tablet Take 100 mg by mouth at bedtime.  10/06/19  Yes [provider]  citalopram (CELEXA) 20 MG tablet Take 20 mg by mouth daily. 10/06/19   [provider]  nicotine polacrilex (NICORETTE) 2 MG gum Take 1 each (2 mg total) by mouth as needed for smoking cessation. Patient not taking: Reported on 10/10/2019 09/29/19   Sanjuana Kava, NP    Past Medical History   Past Medical History:  Diagnosis Date   Anxiety    Depression     History reviewed. No pertinent surgical history.   Allergies  No Known Allergies   Family History    No family history on file.  Social History    Social History   Socioeconomic History   Marital status: Legally Separated    Spouse name: Not on file   Number of children: Not on file   Years of education: Not on file   Highest education level: Not on file  Occupational History   Not on file  Social Needs   Financial resource strain: Not on file   Food insecurity    Worry: Not on file    Inability: Not on file  Transportation needs    Medical: Not on file    Non-medical: Not on file  Tobacco Use   Smoking status: Never Smoker   Smokeless tobacco: Never Used  Substance and Sexual Activity   Alcohol use: Yes    Comment: last etoh was 3 days ago   Drug use: Yes    Types: Marijuana    Comment: everyday- none in 2 weeks   Sexual activity: Yes    Birth control/protection: Condom  Lifestyle   Physical activity    Days per week: Not on file    Minutes per session: Not on file   Stress: Not on file  Relationships   Social connections    Talks on phone: Not on file    Gets  together: Not on file    Attends religious service: Not on file    Active member of club or organization: Not on file    Attends meetings of clubs or organizations: Not on file    Relationship status: Not on file   Intimate partner violence    Fear of current or ex partner: Not on file    Emotionally abused: Not on file    Physically abused: Not on file    Forced sexual activity: Not on file  Other Topics Concern   Not on file  Social History Narrative   Not on file     Review of Systems   General: Denies fevers, chills, myalgias, decreased appetite and fatigue. Reports no changes in sleep habits or weight. Admits to good overall health. Skin: Denies abnormal rashes, lesions, or masses. Cardiovascular: see HPI Respiratory- Denies SOB, wheezing, coughing, increased respirations, or sputum production. No reports of hemoptysis, asthma, or recent bronchitis. Denies the need for extra pillows for sleep at night.  HEENT: Denies visual acuity changes, eye pain, redness, or discharge. Reports adequate hearing acuity bilaterally. Denies drainage, tinnitus, vertigo, or recent earaches. Denies sore throat, hoarseness, or trouble swallowing. Adequate oral dentition.  Gastrointestinal: Denies abdominal, epigastric pain, nausea and vomiting. No reports of heartburn, indigestion, liver or gallbladder abnormalities. Reports regular, consistent bowel movements, without signs and symptoms of diarrhea, constipation, or blood in the stools.  Genitourinary: Denies frequency, urgency, retention, or urinary incontinence. No reports of recent UTI, dysuria, or blood in urine.  Neurological: Denies headaches, syncope, falls, extremity weakness, numbness, and tingling. No changes in mental status. \ All other systems reviewed and are otherwise negative except as noted above.  Physical Exam    Blood pressure 123/87, pulse 78, temperature 99.5 F (37.5 C), resp. rate 16, height 5\' 11"  (1.803 m), weight 73.5 kg,  SpO2 97 %.   General: Alert, calm, well-nourished, in NAD, appears stated age Psych: Somewhat flat affect. Neuro: Alert and oriented, poor recall, no abnormal movements, no gross focal deficits.  HEENT: Eyes symmetric, no orbital edema. Conjunctive pink, sclera white, PERRLA, vision grossly intact.  Neck: Supple neck veins, no bruits or JVD.Trachea midline, no cervical lymphadenopathy.  Lungs: Symmetrical chest wall expansion.CTA w/ RRR. No accessory muscle use.  Heart: RRR no s3, s4, or murmurs.  Abdomen: Soft, non-tender, non-distended, BS + x 4.  Extremities: No clubbing, cyanosis or edema. DP/PT/Radials 2+ and equal bilaterally. MSK: No palpable chest wall or thoracic tenderness  My limited echocardiogram shows mild global hypokinesis without regional WMA, no evidence of RV pressure/volume overload, no pericardial effusion, and structurally normal valves that open well.    Labs    Troponin: 5991, [id number], [id number]  Lab Results  Component  Value Date   WBC 7.2 10/10/2019   HGB 15.7 10/10/2019   HCT 45.5 10/10/2019   MCV 89.6 10/10/2019   PLT 202 10/10/2019    Recent Labs  Lab 10/10/19 1122  NA 136  K 3.8  CL 102  CO2 27  BUN 8  CREATININE 0.83  CALCIUM 9.2  GLUCOSE 106*    Radiology Studies    Dg Chest Portable 1 View  Result Date: 10/10/2019 CLINICAL DATA:  Chest pain EXAM: PORTABLE CHEST 1 VIEW COMPARISON:  11/21/2009 FINDINGS: 1108 hours. The lungs are clear without focal pneumonia, edema, pneumothorax or pleural effusion. The cardiopericardial silhouette is within normal limits for size. The visualized bony structures of the thorax are intact. Telemetry leads overlie the chest. IMPRESSION: No active disease. Electronically Signed   By: Kennith CenterEric  Mansell M.D.   On: 10/10/2019 11:48    ECG & Cardiac Imaging    Limited echocardiogram: mild global hypokinesis without regional WMA, no evidence of RV pressure/volume overload, no pericardial effusion, and structurally normal  valves that open well.   ECG: sinus tachycardia, incomplete RBBB, ST elevation and PR depression diffusely but most prominent in inferior and anterolateral leads.  Assessment & Plan    1. 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 He has no clear friction rub, no effusion, and no chest pain currently, so I don't think there is significant pericardial involvement, likely this is primarily myocarditis. He is hemodynamically stable and well perfused. Though he is tachycardic with RBBB, PE is less likely without any dyspnea, pleuritic chest pain, or echo findings of RV pressure overload. Regardless, I will keep him heparinized pending CMR findings. Stress CM is also possible though global HK makes this less likely. He has some features of hyperthyroidism - tachycardia, poor appetite, etc. We will recheck TSH. Other potential myocarditis etiology may be related to vaporized THC product. Troponin now down-trending. - CMR w/wo contrast in the morning - If CMR not c/w peri/myocarditis, then would proceed with CTA chest to r/o PE - Continue heparin and aspirin pending imaging results - Monitor on telemetry - Metoprolol 12.5mg  BID - Chest pain free, no indication for anti-inflammatory treatment - Check TSH and HIV  2.  MDD/GAD: [facility] with [facility] ideation.  Patient reported medication changes which have helped his symptoms. Denies SI at this time - Continue current regimen - Check TSH  Diet: NPO at midnight DVT ppx: therapeutic heparin Goals of Care: Full Code Disposition: admit to cardiac telemetry floor Signed,  Wilkie AyeScott Jenelle Drennon, MD [facility] 10/10/2019, 4:47 AM

## 2019-10-10 NOTE — Progress Notes (Signed)
Pt received on unit, transfer from [facility]. A&Ox4. Denies c/o chest pain current. Vital signs stable, B/P 122/77, HR 96, O2 sat 99%,  afebrile. Oriented pt to room and unit. Will page Dr. Kalman Shan Cards on call for orders.

## 2019-10-10 NOTE — Progress Notes (Addendum)
ANTICOAGULATION CONSULT NOTE  Pharmacy Consult:  Heparin Indication: chest pain/ACS  No Known Allergies  Patient Measurements: Height: 5\' 11"  (180.3 cm) Weight: 162 lb 4.1 oz (73.6 kg) IBW/kg (Calculated) : 75.3 Heparin Dosing Weight: 73 kg  Vital Signs: Temp: 98 F (36.7 C) (11/22 2008) Temp Source: Oral (11/22 2008) BP: 126/86 (11/22 2008) Pulse Rate: 83 (11/22 2008)  Labs: Recent Labs    10/10/19 1122 10/10/19 1315 10/10/19 2001  HGB 15.7  --   --   HCT 45.5  --   --   PLT 202  --   --   HEPARINUNFRC  --   --  <0.10*  CREATININE 0.83  --   --   TROPONINIHS 5,991* 8,040*  --     Estimated Creatinine Clearance: 142.9 mL/min (by C-G formula based on SCr of 0.83 mg/dL).  Assessment: 23 YOM to continue on IV heparin for chest pain/ACS.  Initial heparin level is undetectable.  Per charting, heparin was paused for about 10 min around 1500, but no issue with heparin infusion since per RN.  No bleeding reported.  Goal of Therapy:  Heparin level 0.3-0.7 units/ml Monitor platelets by anticoagulation protocol: Yes   Plan:  Rebolus with heparin 2100 units IV, then Increase heparin gtt to 1150 units/hr Check 6 hr heparin level  Chamika Cunanan D. Mina Marble, PharmD, BCPS, [place] 10/10/2019, 9:16 PM

## 2019-10-11 ENCOUNTER — Inpatient Hospital Stay (HOSPITAL_COMMUNITY): Payer: Self-pay

## 2019-10-11 DIAGNOSIS — I319 Disease of pericardium, unspecified: Secondary | ICD-10-CM

## 2019-10-11 DIAGNOSIS — F330 Major depressive disorder, recurrent, mild: Secondary | ICD-10-CM

## 2019-10-11 DIAGNOSIS — R079 Chest pain, unspecified: Secondary | ICD-10-CM

## 2019-10-11 LAB — SEDIMENTATION RATE: Sed Rate: 1 mm/hr (ref 0–16)

## 2019-10-11 LAB — ECHOCARDIOGRAM COMPLETE
Height: 71 in
Weight: 2500.8 oz

## 2019-10-11 LAB — HIV ANTIBODY (ROUTINE TESTING W REFLEX): HIV Screen 4th Generation wRfx: NONREACTIVE

## 2019-10-11 LAB — TSH: TSH: 1.08 u[IU]/mL (ref 0.350–4.500)

## 2019-10-11 LAB — HEPARIN LEVEL (UNFRACTIONATED): Heparin Unfractionated: 0.27 IU/mL — ABNORMAL LOW (ref 0.30–0.70)

## 2019-10-11 LAB — C-REACTIVE PROTEIN: CRP: 1.2 mg/dL — ABNORMAL HIGH (ref <1.0)

## 2019-10-11 MED ORDER — GADOBUTROL 1 MMOL/ML IV SOLN
10.0000 mL | Freq: Once | INTRAVENOUS | Status: AC | PRN
  Administered 2019-10-11: 10 mL via INTRAVENOUS

## 2019-10-11 MED ORDER — SERTRALINE HCL 50 MG PO TABS
50.0000 mg | ORAL_TABLET | Freq: Every day | ORAL | Status: DC
  Administered 2019-10-12: 50 mg via ORAL
  Filled 2019-10-11: qty 1

## 2019-10-11 MED ORDER — TRAZODONE HCL 50 MG PO TABS
150.0000 mg | ORAL_TABLET | Freq: Every day | ORAL | Status: DC
  Administered 2019-10-11: 150 mg via ORAL
  Filled 2019-10-11: qty 1

## 2019-10-11 MED ORDER — HEPARIN SODIUM (PORCINE) 5000 UNIT/ML IJ SOLN: 5000.0000 [IU] | Freq: Three times a day (TID) | INTRAMUSCULAR | Status: DC

## 2019-10-11 MED ORDER — COLCHICINE 0.6 MG PO TABS
0.6000 mg | ORAL_TABLET | Freq: Two times a day (BID) | ORAL | Status: DC
  Administered 2019-10-11 – 2019-10-12 (×3): 0.6 mg via ORAL
  Filled 2019-10-11 (×3): qty 1

## 2019-10-11 MED ORDER — TRAZODONE HCL 100 MG PO TABS: 200.0000 mg | ORAL_TABLET | Freq: Every day | ORAL | Status: DC

## 2019-10-11 NOTE — Progress Notes (Signed)
CRITICAL VALUE ALERT  Critical Value:  Troponin 6,052  Date & Time Notied:  10/10/2019 at 2207  Provider Notified: On call with Cardiology on unit and was notified about 2210  Orders Received/Actions taken: no new orders received

## 2019-10-11 NOTE — Progress Notes (Signed)
Progress Note  Patient Name: Keith Parks Date of Encounter: 10/11/2019  Primary Cardiologist: No primary care provider on file.   Subjective   Denies any chest pain or SOB  Inpatient Medications    Scheduled Meds: . aspirin EC  81 mg Oral Daily  . atorvastatin  40 mg Oral q1800  . feeding supplement (ENSURE ENLIVE)  237 mL Oral BID BM  . metoprolol tartrate  12.5 mg Oral BID  . sertraline  25 mg Oral Daily  . traZODone  100 mg Oral QHS   Continuous Infusions: . heparin 1,300 Units/hr (10/11/19 0714)   PRN Meds: acetaminophen, hydrOXYzine, ondansetron (ZOFRAN) IV   Vital Signs    Vitals:   10/10/19 1807 10/10/19 2008 10/10/19 2151 10/11/19 0654  BP: 122/77 126/86 118/83 117/75  Pulse: 96 83 93 (!) 57  Resp:      Temp: 98.4 F (36.9 C) 98 F (36.7 C)  97.7 F (36.5 C)  TempSrc: Oral Oral  Oral  SpO2: 99% 97%  98%  Weight: 73.6 kg   70.9 kg  Height: 5\' 11"  (1.803 m)       Intake/Output Summary (Last 24 hours) at 10/11/2019 0844 Last data filed at 10/11/2019 0700 Gross per 24 hour  Intake 847.42 ml  Output -  Net 847.42 ml   Filed Weights   10/10/19 1048 10/10/19 1807 10/11/19 0654  Weight: 73.5 kg 73.6 kg 70.9 kg    Telemetry    NSR - Personally Reviewed  ECG    NSR with diffuse ST elevation - Personally Reviewed  Physical Exam   GEN: No acute distress.   Neck: No JVD, no carotid bruits Cardiac:  RRR, no murmurs, rubs, or gallops.  Respiratory: Clear to auscultation bilaterally, no wheezes/ rales/ rhonchi GI: NABS, Soft, nontender, non-distended  MS: No edema; No deformity. Neuro:  Nonfocal, moving all extremities spontaneously Psych: Normal affect   Labs    Chemistry Recent Labs  Lab 10/10/19 1122  NA 136  K 3.8  CL 102  CO2 27  GLUCOSE 106*  BUN 8  CREATININE 0.83  CALCIUM 9.2  GFRNONAA >60  GFRAA >60  ANIONGAP 7     Hematology Recent Labs  Lab 10/10/19 1122  WBC 7.2  RBC 5.08  HGB 15.7  HCT 45.5  MCV 89.6   MCH 30.9  MCHC 34.5  RDW 11.9  PLT 202    Cardiac EnzymesNo results for input(s): TROPONINI in the last 168 hours. No results for input(s): TROPIPOC in the last 168 hours.   BNPNo results for input(s): BNP, PROBNP in the last 168 hours.   DDimer No results for input(s): DDIMER in the last 168 hours.   Radiology    Dg Chest Portable 1 View  Result Date: 10/10/2019 CLINICAL DATA:  Chest pain EXAM: PORTABLE CHEST 1 VIEW COMPARISON:  11/21/2009 FINDINGS: 1108 hours. The lungs are clear without focal pneumonia, edema, pneumothorax or pleural effusion. The cardiopericardial silhouette is within normal limits for size. The visualized bony structures of the thorax are intact. Telemetry leads overlie the chest. IMPRESSION: No active disease. Electronically Signed   By: Misty Stanley M.D.   On: 10/10/2019 11:48    Cardiac Studies   none  Patient Profile     23 y.o. male with PMH of major depression, generalized anxiety, and tobacco use who presented to [facility] with chest pain which began 1 hour prior to presentation. High-sensitivity troponin found to be markedly elevated therefore plan is for  transfer to [facility] for further cardiac evaluation.  Assessment & Plan    1.  Chest pain -? Etiology -pain atypical but troponin markedly elevated [phone]). -in his age group the likely etiology of trop elevation is acute pericarditis or myocarditis or anomalous coronary artery -EKG shows diffuse ST elevation suggesting possible pericarditis or myocarditis -does not give a hx of recent URI and no pleuritic component. -Just came back from cardiac MRI and results pending -will start colchicine 0.6mg  BID -check CRP and sed rate -if cardiac MRI normal then plan left heart cath to define coronary anatomy -continue ASA, BB and statin as well as IV Heparin gtt for now  2.  Major Depression -would like Psych consult for issues with psych meds while inpt  For questions or updates,  please contact [facility] Please consult [email or web address] for contact info under Cardiology/STEMI.      Signed, Beatriz Stallion, PA-C  10/11/2019, 8:44 AM   [phone]

## 2019-10-11 NOTE — Progress Notes (Signed)
ANTICOAGULATION CONSULT NOTE Pharmacy Consult:  Heparin Indication: chest pain/ACS  No Known Allergies  Patient Measurements: Height: 5\' 11"  (180.3 cm) Weight: 162 lb 4.1 oz (73.6 kg) IBW/kg (Calculated) : 75.3 Heparin Dosing Weight: 73 kg  Vital Signs: Temp: 98 F (36.7 C) (11/22 2008) Temp Source: Oral (11/22 2008) BP: 118/83 (11/22 2151) Pulse Rate: 93 (11/22 2151)  Labs: Recent Labs    10/10/19 1122 10/10/19 1315 10/10/19 2001 10/11/19 0419  HGB 15.7  --   --   --   HCT 45.5  --   --   --   PLT 202  --   --   --   HEPARINUNFRC  --   --  <0.10* 0.27*  CREATININE 0.83  --   --   --   TROPONINIHS 5,991* 8,040* 6,052*  --     Estimated Creatinine Clearance: 142.9 mL/min (by C-G formula based on SCr of 0.83 mg/dL).  Assessment: 23 y.o. male with chest pain for heparin   Goal of Therapy:  Heparin level 0.3-0.7 units/ml Monitor platelets by anticoagulation protocol: Yes   Plan:  Increase Heparin 1300 units/hr  Phillis Knack, PharmD, BCPS   10/11/2019, 5:17 AM

## 2019-10-11 NOTE — Discharge Summary (Signed)
Discharge Summary    Patient ID: Keith Parks,  MRN: [id number], DOB/AGE: 24/17/1997 24 y.o.  Admit date: 10/10/2019 Discharge date: 10/12/2019  Primary Care Provider: Patient, No Pcp Per Primary Cardiologist: Armanda Magicraci Turner, MD  Discharge Diagnoses    Principal Problem:   Myopericarditis Active Problems:   Generalized anxiety disorder with panic attacks   Allergies No Known Allergies  Diagnostic Studies/Procedures    Cardiac MR 10/11/2019: IMPRESSION: 1. Subepicardial late gadolinium enhancement along the lateral and inferior walls. Also with focal areas of elevated native T1, T2, and ECV in the lateral wall. These findings are consistent with an acute myocarditis.  2. Likely delayed enhancement of visceral pericardium along lateral wall, suggesting component of pericarditis as well. Trace pericardial effusion.  3. Mild LV dilatation with mild systolic dysfunction (EF 51%). Focal hypokinesis in mid inferolateral wall  4.  Mild RV dilatation with mild systolic dysfunction (EF 46%)  Echocardiogram 10/11/2019:  _____________   History of Present Illness     23 year old male with a history of major depression, generalized anxiety, and tobacco use who presented to [facility] with chest pain which began 1 hour prior to presentation. He [facility] on 10/10/2019 with chest pain that "he has been telling everyone" has been intermittent for a day, but probably has been off and on for 3 days. It is not exertional, pleuritic, or positional. He has very poor recall of any details - doesn't remember how long the episodes last, what they feel like, or how frequently they occur. He reports being sedentary and they just occur seemingly at random. This has been associated with palpitations in that his heart feels like its racing. He denies dyspnea, orthopnea, swelling, or PND. No syncope. Appetite has been poor. No diaphoresis or unintentional  weight loss, no tremor he can recall. No drug use other than marijuana, last use reported 2 weeks ago, UDS positive for THC. He does vape THC products as well. Thinks it is very unlikely there is any contamination with other drugs. No recent illnesses. His sister had COVID-19 >1 month ago. He lives with his grandmother who works at a nursing home with regular COVID-19 testing but no confirmed infection. He was negative for COVID-19 09/29/2019 during [facility].   In the ED, initial ECG showed diffuse ST elevation with associated PR depression and incomplete RBBB. HS-troponin elevated at 5991. Pain free for at least 2 hours. CXR with no acute abnormalities or other active pathology. POC COVID-19 negative. All other lab work within normal limits. VS wnl, though temperature is 99.7 oral.   Hospital Course     Consultants: Psych   1. Myopericarditis: patient presented with chest pain, found to have diffuse STE on EKG. HsTrops peaked at 8040. POC COVID-19 test was negative; confirmatory test pending at the time of discharge. He underwent a cardiac MR which revealed findings c/w acute myocarditis with a component of pericarditis as well. He was noted to have focal hypokinesis in the mid inferolateral wall with EF estimated to be 51%. Echocardiogram showed EF 50-55%, basal and mid inferolateral wall motion abnormalities, and no significant valvular abnormalities. He was started on colchicine 0.6mg  BID.  - Continue colchicine 0.6mg  BID indefinitely - Strict instructions given to avoid exercise for at least 6 months - Strict instructions given to avoid OTC NSAID use (ibuprofen/motrin/naproxen/naprosyn)  2. Generalized anxiety disorder/depression: patient with [facility] ide[facility]. He reported issues with his  psych medications and psychiatry was consulted. He was recommended to increase his zoloft to 50mg  daily to improve his axiety/depression and increase his  trazodone to 150mg  qHS for assistance with insomnia.  - Continue zoloft and trazodone _____________  Discharge Vitals Blood pressure 112/72, pulse 63, temperature 97.9 F (36.6 C), temperature source Oral, resp. rate 20, height 5\' 11"  (1.803 m), weight 71 kg, SpO2 96 %.  Filed Weights   10/10/19 1807 10/11/19 0654 10/12/19 0544  Weight: 73.6 kg 70.9 kg 71 kg    Labs & Radiologic Studies    CBC Recent Labs    10/10/19 1122 10/12/19 0359  WBC 7.2 7.3  HGB 15.7 16.2  HCT 45.5 46.1  MCV 89.6 88.8  PLT 202 762   Basic Metabolic Panel Recent Labs    10/10/19 1122  NA 136  K 3.8  CL 102  CO2 27  GLUCOSE 106*  BUN 8  CREATININE 0.83  CALCIUM 9.2   Liver Function Tests No results for input(s): AST, ALT, ALKPHOS, BILITOT, PROT, ALBUMIN in the last 72 hours. No results for input(s): LIPASE, AMYLASE in the last 72 hours. Cardiac Enzymes No results for input(s): CKTOTAL, CKMB, CKMBINDEX, TROPONINI in the last 72 hours. BNP Invalid input(s): POCBNP D-Dimer No results for input(s): DDIMER in the last 72 hours. Hemoglobin A1C No results for input(s): HGBA1C in the last 72 hours. Fasting Lipid Panel Recent Labs    10/10/19 2001  CHOL 122  HDL 51  LDLCALC 52  TRIG 96  CHOLHDL 2.4   Thyroid Function Tests Recent Labs    10/11/19 0419  TSH 1.080   _____________  Dg Chest Portable 1 View  Result Date: 10/10/2019 CLINICAL DATA:  Chest pain EXAM: PORTABLE CHEST 1 VIEW COMPARISON:  11/21/2009 FINDINGS: 1108 hours. The lungs are clear without focal pneumonia, edema, pneumothorax or pleural effusion. The cardiopericardial silhouette is within normal limits for size. The visualized bony structures of the thorax are intact. Telemetry leads overlie the chest. IMPRESSION: No active disease. Electronically Signed   By: Misty Stanley M.D.   On: 10/10/2019 11:48   Mr Cardiac Morphology W Wo Contrast  Result Date: 10/11/2019 CLINICAL DATA:  Concern for myocarditis EXAM:  CARDIAC MRI TECHNIQUE: The patient was scanned on a 1.5 Tesla Siemens magnet. A dedicated cardiac coil was used. Functional imaging was done using Fiesta sequences. 2,3, and 4 chamber views were done to assess for RWMA's. Modified Simpson's rule using a short axis stack was used to calculate an ejection fraction on a dedicated work Conservation officer, nature. The patient received 10 cc of Gadavist. After 10 minutes inversion recovery sequences were used to assess for infiltration and scar tissue. CONTRAST:  10 cc  of Gadavist FINDINGS: Left ventricle: - Mild dilatation - Mild systolic dysfunction. Focal hypokinesis in mid inferolateral wall - Focal elevations in native T1/ECV and T2 along the lateral wall - Subepicardial LGE in the inferior and lateral walls LV EF: 51% (Normal 56-78%) Absolute volumes: LV EDV: 152mL (Normal 77-195 mL) LV ESV: 78mL (Normal 19-72 mL) LV SV: 73mL (Normal 51-133 mL) CO: 5.5L/min (Normal 2.8-8.8 L/min) Indexed volumes: LV EDV: 162mL/sq-m (Normal 47-92 mL/sq-m) LV ESV: 10mL/sq-m (Normal 13-30 mL/sq-m) LV SV: 71mL/sq-m (Normal 32-62 mL/sq-m) CI: 2.9L/min/sq-m (Normal 1.7-4.2 L/min/sq-m) Right ventricle: Mild dilatation.  Mild systolic dysfunction RV EF:  46% (Normal 47-74%) Absolute volumes: RV EDV: 266mL (Normal 88-227 mL) RV ESV: 129mL (Normal 23-103 mL) RV SV: 46mL (Normal 52-138 mL) CO: 5.6L/min (Normal 2.8-8.8 L/min) Indexed  volumes: RV EDV: 159mL/sq-m (Normal 55-105 mL/sq-m) RV ESV: 24mL/sq-m (Normal 15-43 mL/sq-m) RV SV: 18mL/sq-m (Normal 32-64 mL/sq-m) CI: 2.9L/min/sq-m (Normal 1.7-4.2 L/min/sq-m) Left atrium: Normal size Right atrium: Mild enlargement Mitral valve: No regurgitation Aortic valve: Not well visualized Tricuspid valve: No regurgitation Pericardium: Trace effusion. Likely LGE of visceral pericardium along lateral wall IMPRESSION: 1. Subepicardial late gadolinium enhancement along the lateral and inferior walls. Also with focal areas of elevated native T1, T2, and  ECV in the lateral wall. These findings are consistent with an acute myocarditis. 2. Likely delayed enhancement of visceral pericardium along lateral wall, suggesting component of pericarditis as well. Trace pericardial effusion. 3. Mild LV dilatation with mild systolic dysfunction (EF 51%). Focal hypokinesis in mid inferolateral wall 4.  Mild RV dilatation with mild systolic dysfunction (EF 46%) Electronically Signed   By: Epifanio Lesches MD   On: 10/11/2019 14:42   Disposition   Patient was seen and examined by Dr. Mayford Knife who deemed patient as stable for discharge. Follow-up has been arranged. Discharge medications as listed below.   Follow-up Plans & Appointments    Follow-up Information    Sherald Hess, NP Follow up on 10/21/2019.   Specialty: Cardiology Why: Please arrive 15 minutes early for your 9:30am post-hospital cardiology [facility] entrance C - parking code [id number] Contact information: [address] [place] [address] [phone]            Discharge Medications   Allergies as of 10/12/2019   No Known Allergies     Medication List    TAKE these medications   acetaminophen 325 MG tablet Commonly known as: TYLENOL Take 2 tablets (650 mg total) by mouth every 4 (four) hours as needed for headache or mild pain.   colchicine 0.6 MG tablet Take 1 tablet (0.6 mg total) by mouth 2 (two) times daily.   hydrOXYzine 25 MG tablet Commonly known as: ATARAX/VISTARIL Take 1 tablet (25 mg total) by mouth 3 (three) times daily as needed for anxiety.   metoprolol tartrate 25 MG tablet Commonly known as: LOPRESSOR Take 0.5 tablets (12.5 mg total) by mouth 2 (two) times daily.   sertraline 50 MG tablet Commonly known as: ZOLOFT Take 1 tablet (50 mg total) by mouth daily. Please contact your outpatient psychiatrist for refills. What changed:   medication strength  how much to take  additional instructions    traZODone 150 MG tablet Commonly known as: DESYREL Take 1 tablet (150 mg total) by mouth at bedtime. Please contact your outpatient psychiatrist for refills. What changed:   medication strength  how much to take  additional instructions         Outstanding Labs/Studies   None  Duration of Discharge Encounter   Greater than 30 minutes including physician time.  Signed, Beatriz Stallion PA-C 10/12/2019, 9:52 AM

## 2019-10-11 NOTE — Progress Notes (Signed)
  Echocardiogram 2D Echocardiogram has been performed.  Keith Parks A Keith Parks 10/11/2019, 5:03 PM

## 2019-10-11 NOTE — TOC Initial Note (Signed)
Transition of Care [facility]) - Initial/Assessment Note    Patient Details  Name: Keith Parks MRN: [id number] Date of Birth: 18-Jul-1996  Transition of [facility]) CM/SW Contact:    Gelene Mink, [place] Phone Number: 10/11/2019, 1:54 PM  Clinical Narrative:                  CSW met with the patient at beside. Patient stated he is established with the [facility]. He stated he does not have his benefits card but he will go pick it up. He stated he has a PCP through the [place] but does not know the name. He stated he can afford his meds.   CSW will continue to follow for TOC needs.    Expected Discharge Plan: Home/Self Care Barriers to Discharge: Continued Medical Work up   Patient Goals and CMS Choice Patient states their goals for this hospitalization and ongoing recovery are:: Pt will go home      Expected Discharge Plan and Services Expected Discharge Plan: Home/Self Care In-house Referral: NA Discharge Planning Services: NA Post Acute Care Choice: NA Living arrangements for the past 2 months: Single Family Home                           HH Arranged: NA          Prior Living Arrangements/Services Living arrangements for the past 2 months: Single Family Home Lives with:: Parents, Siblings Patient language and need for interpreter reviewed:: No Do you feel safe going back to the place where you live?: Yes      Need for Family Participation in Patient Care: No (Comment) Care giver support system in place?: No (comment)   Criminal Activity/Legal Involvement Pertinent to Current Situation/Hospitalization: No - Comment as needed  Activities of Daily Living Home Assistive Devices/Equipment: None ADL Screening (condition at time of admission) Patient's cognitive ability adequate to safely complete daily activities?: Yes Is the patient deaf or have difficulty hearing?: No Does the patient have difficulty seeing, even when wearing glasses/contacts?: No Does the patient  have difficulty concentrating, remembering, or making decisions?: No Patient able to express need for assistance with ADLs?: Yes Does the patient have difficulty dressing or bathing?: No Independently performs ADLs?: Yes (appropriate for developmental age) Does the patient have difficulty walking or climbing stairs?: Yes(going down stairs per patient ) Weakness of Legs: Right(per pt had an ankle injury about 3 years ago ) Weakness of Arms/Hands: None  Permission Sought/Granted Permission sought to share information with : Case Manager Permission granted to share information with : Yes, Verbal Permission Granted              Emotional Assessment Appearance:: Appears stated age Attitude/Demeanor/Rapport: Engaged Affect (typically observed): Calm Orientation: : Oriented to Self, Oriented to Place, Oriented to  Time, Oriented to Situation Alcohol / Substance Use: Not Applicable Psych Involvement: No (comment)  Admission diagnosis:  Myocardial injury, initial encounter [S26.90XA] Patient Active Problem List   Diagnosis Date Noted  . Severe recurrent major depression without psychotic features ([place]) 09/28/2019  . Generalized anxiety disorder with panic attacks 09/28/2019  . Major depressive disorder, recurrent episode, moderate ([place])   . MDD (major depressive disorder) 09/22/2014  . Major depressive disorder, recurrent, severe without psychotic features ([place])   . Suicidal ideation    PCP:  Patient, No Pcp Per Pharmacy:   [facility] [address] [place]  [place] [address] Phone: [phone] Fax: [phone]     Social Determinants of Health (SDOH) Interventions    Readmission Risk Interventions No flowsheet data found.

## 2019-10-11 NOTE — Consult Note (Signed)
[facility] Consult   Reason for Consult:  Medication questions Referring Physician:  Dr Jacobo Forest [name] MRN:  [id number] Principal Diagnosis: Cardiac issues Diagnosis:  Active Problems:   * No active hospital problems. *   Total Time spent with patient: 1 hour  Subjective:   Keith Parks is a 23 y.o. male patient admitted with cardiac abnormalities.  Patient seen and evaluated in person by this provider.  He reports he was stabilized inpatient at [facility] behavioral health on Zoloft which stopped his depression and suicidal thoughts.  When he followed up at [facility], however, he was changed to Celexa which did not work for him.  His anxiety increased along with panic attacks.  Zoloft restarted in the hospital and patient denies any depression or suicidal ideations at this time or side effects.  His trazodone 100 mg at bedtime was working but not currently, recommend increasing the dose.  He reports needing something for anxiety and discussed the recommendation to increase his Zoloft, see treatment plan below.  He has a follow-up appointment at the [facility] on December 7 to establish care in the mental health clinic.  He request prescription refills until this time.  HPI on admission:  Keith Parks is a 23 year old male with a history as stated above who presented to [facility] on 10/10/2019 with chest pain that "he has been telling everyone" has been intermittent for a day, but probably has been off and on for 3 days. It is not exertional, pleuritic, or positional. He has very poor recall of any details - doesn't remember how long the episodes last, what they feel like, or how frequently they occur. He reports being sedentary and they just occur seemingly at random. This has been associated with palpitations in that his heart feels like its racing. He denies dyspnea, orthopnea, swelling, or PND. No syncope. Appetite has been poor. No diaphoresis  or unintentional weight loss, no tremor he can recall. No drug use other than marijuana, last use reported 2 weeks ago, UDS positive for THC. He does vape THC products as well. Thinks it is very unlikely there is any contamination with other drugs. No recent illnesses.   Past Psychiatric History: depression, anxiety  Risk to Self:  none Risk to Others:  none Prior Inpatient Therapy:  [facility] Prior Outpatient Therapy:  [facility]  Past Medical History:  Past Medical History:  Diagnosis Date  . Anxiety   . Depression    History reviewed. No pertinent surgical history. Family History: No family history on file. Family Psychiatric  History: none Social History:  Social History   Substance and Sexual Activity  Alcohol Use Yes   Comment: last etoh was 3 days ago     Social History   Substance and Sexual Activity  Drug Use Yes  . Types: Marijuana   Comment: everyday- none in 2 weeks    Social History   Socioeconomic History  . Marital status: Legally Separated    Spouse name: Not on file  . Number of children: Not on file  . Years of education: Not on file  . Highest education level: Not on file  Occupational History  . Not on file  Social Needs  . Financial resource strain: Not on file  . Food insecurity    Worry: Not on file    Inability: Not on file  . Transportation needs    Medical: Not on file    Non-medical: Not on file  Tobacco Use  . Smoking status: Never Smoker  . Smokeless tobacco: Never Used  Substance and Sexual Activity  . Alcohol use: Yes    Comment: last etoh was 3 days ago  . Drug use: Yes    Types: Marijuana    Comment: everyday- none in 2 weeks  . Sexual activity: Yes    Birth control/protection: Condom  Lifestyle  . Physical activity    Days per week: Not on file    Minutes per session: Not on file  . Stress: Not on file  Relationships  . Social Herbalist on phone: Not on file    Gets together: Not on file    Attends religious  service: Not on file    Active member of club or organization: Not on file    Attends meetings of clubs or organizations: Not on file    Relationship status: Not on file  Other Topics Concern  . Not on file  Social History Narrative  . Not on file   Additional Social History:    Allergies:  No Known Allergies  Labs:  Results for orders placed or performed during the hospital encounter of 10/10/19 (from the past 48 hour(s))  CBG monitoring, ED     Status: Abnormal   Collection Time: 10/10/19 11:06 AM  Result Value Ref Range   Glucose-Capillary 106 (H) 70 - 99 mg/dL  Basic metabolic panel     Status: Abnormal   Collection Time: 10/10/19 11:22 AM  Result Value Ref Range   Sodium 136 135 - 145 mmol/L   Potassium 3.8 3.5 - 5.1 mmol/L   Chloride 102 98 - 111 mmol/L   CO2 27 22 - 32 mmol/L   Glucose, Bld 106 (H) 70 - 99 mg/dL   BUN 8 6 - 20 mg/dL   Creatinine, Ser 0.83 0.61 - 1.24 mg/dL   Calcium 9.2 8.9 - 10.3 mg/dL   GFR calc non Af Amer >60 >60 mL/min   GFR calc Af Amer >60 >60 mL/min   Anion gap 7 5 - 15    Comment: Performed at [facility], [address] [place], [place] [address]  Troponin I (High Sensitivity)     Status: Abnormal   Collection Time: 10/10/19 11:22 AM  Result Value Ref Range   Troponin I (High Sensitivity) 5,991 (HH) <18 ng/L    Comment: CRITICAL RESULT CALLED TO, READ BACK BY AND VERIFIED WITH:  LANETTE W.,RN '@1222'  10/10/2019 KAY (NOTE) Elevated high sensitivity troponin I (hsTnI) values and significant  changes across serial measurements may suggest ACS but many other  chronic and acute conditions are known to elevate hsTnI results.  Refer to the Links section for chest pain algorithms and additional  guidance. Performed at [facility], [address], [place], [place] [address]   CBC     Status: None   Collection Time: 10/10/19 11:22 AM  Result Value Ref Range   WBC 7.2 4.0 - 10.5 K/uL   RBC 5.08 4.22 - 5.81 MIL/uL   Hemoglobin 15.7 13.0 -  17.0 g/dL   HCT 45.5 39.0 - 52.0 %   MCV 89.6 80.0 - 100.0 fL   MCH 30.9 26.0 - 34.0 pg   MCHC 34.5 30.0 - 36.0 g/dL   RDW 11.9 11.5 - 15.5 %   Platelets 202 150 - 400 K/uL   nRBC 0.0 0.0 - 0.2 %    Comment: Performed at [facility], [address], [place], [place] [address]  Troponin I (High Sensitivity)     Status: Abnormal   Collection Time: 10/10/19  1:15 PM  Result Value Ref Range   Troponin I (High Sensitivity) 8,040 (HH) <18 ng/L    Comment: CRITICAL RESULT CALLED TO, READ BACK BY AND VERIFIED WITH: MANDY C.,RN '@1410'  11/22/2020KAY (NOTE) Elevated high sensitivity troponin I (hsTnI) values and significant  changes across serial measurements may suggest ACS but many other  chronic and acute conditions are known to elevate hsTnI results.  Refer to the Links section for chest pain algorithms and additional  guidance. Performed at [facility], [address], [place], [place] [address]   POC SARS Coronavirus 2 Ag-ED - Nasal Swab (BD Veritor Kit)     Status: None   Collection Time: 10/10/19  1:29 PM  Result Value Ref Range   SARS Coronavirus 2 Ag NEGATIVE NEGATIVE    Comment: (NOTE) SARS-CoV-2 antigen NOT DETECTED.  Negative results are presumptive.  Negative results do not preclude SARS-CoV-2 infection and should not be used as the sole basis for treatment or other patient management decisions, including infection  control decisions, particularly in the presence of clinical signs and  symptoms consistent with COVID-19, or in those who have been in contact with the virus.  Negative results must be combined with clinical observations, patient history, and epidemiological information. The expected result is Negative. Fact Sheet for Patients: PodPark.tn Fact Sheet for Healthcare Providers: GiftContent.is This test is not yet approved or cleared by the [place] FDA and  has been authorized for detection and/or  diagnosis of SARS-CoV-2 by FDA under an Emergency Use Authorization (EUA).  This EUA will remain in effect (meaning this test can be used) for the duration of  the COVID-19 de claration under Section 564(b)(1) of the Act, 21 U.S.C. section 360bbb-3(b)(1), unless the authorization is terminated or revoked sooner.   Rapid urine drug screen (hospital performed)     Status: Abnormal   Collection Time: 10/10/19  3:09 PM  Result Value Ref Range   Opiates NONE DETECTED NONE DETECTED   Cocaine NONE DETECTED NONE DETECTED   Benzodiazepines NONE DETECTED NONE DETECTED   Amphetamines NONE DETECTED NONE DETECTED   Tetrahydrocannabinol POSITIVE (A) NONE DETECTED   Barbiturates NONE DETECTED NONE DETECTED    Comment: (NOTE) DRUG SCREEN FOR MEDICAL PURPOSES ONLY.  IF CONFIRMATION IS NEEDED FOR ANY PURPOSE, NOTIFY LAB WITHIN 5 DAYS. LOWEST DETECTABLE LIMITS FOR URINE DRUG SCREEN Drug Class                     Cutoff (ng/mL) Amphetamine and metabolites    1000 Barbiturate and metabolites    200 Benzodiazepine                 212 Tricyclics and metabolites     300 Opiates and metabolites        300 Cocaine and metabolites        300 THC                            50 Performed at [facility], [address] [place], [place] [address]   Heparin level (unfractionated)     Status: Abnormal   Collection Time: 10/10/19  8:01 PM  Result Value Ref Range   Heparin Unfractionated <0.10 (L) 0.30 - 0.70 IU/mL    Comment: (NOTE) If heparin results are below expected values, and patient dosage has  been confirmed, suggest follow up  testing of antithrombin III levels. Performed at [facility], [place] [address], [place], [place] [address]   Troponin I (High Sensitivity)     Status: Abnormal   Collection Time: 10/10/19  8:01 PM  Result Value Ref Range   Troponin I (High Sensitivity) 6,052 (HH) <18 ng/L    Comment: CRITICAL RESULT CALLED TO, READ BACK BY AND VERIFIED WITH: BUCKNER B,RN 10/10/19  [address] [place] Performed at [facility] [address], [place], [place] [address]   Lipid panel     Status: None   Collection Time: 10/10/19  8:01 PM  Result Value Ref Range   Cholesterol 122 0 - 200 mg/dL   Triglycerides 96 <150 mg/dL   HDL 51 >40 mg/dL   Total CHOL/HDL Ratio 2.4 RATIO   VLDL 19 0 - 40 mg/dL   LDL Cholesterol 52 0 - 99 mg/dL    Comment:        Total Cholesterol/HDL:CHD Risk Coronary Heart Disease Risk Table                     Men   Women  1/2 Average Risk   3.4   3.3  Average Risk       5.0   4.4  2 X Average Risk   9.6   7.1  3 X Average Risk  23.4   11.0        Use the calculated Patient Ratio above and the CHD Risk Table to determine the patient's CHD Risk.        ATP III CLASSIFICATION (LDL):  <100     mg/dL   Optimal  100-129  mg/dL   Near or Above                    Optimal  130-159  mg/dL   Borderline  160-189  mg/dL   High  >190     mg/dL   Very High Performed at [facility] [address] [place], [place] [address]   Heparin level (unfractionated)     Status: Abnormal   Collection Time: 10/11/19  4:19 AM  Result Value Ref Range   Heparin Unfractionated 0.27 (L) 0.30 - 0.70 IU/mL    Comment: (NOTE) If heparin results are below expected values, and patient dosage has  been confirmed, suggest follow up testing of antithrombin III levels. Performed at [facility], [place] [address], [place], [place] [address]   HIV Antibody (routine testing w rflx)     Status: None   Collection Time: 10/11/19  4:19 AM  Result Value Ref Range   HIV Screen 4th Generation wRfx NON REACTIVE NON REACTIVE    Comment: Performed at [facility] [address], [place], [place] [address]  TSH     Status: None   Collection Time: 10/11/19  4:19 AM  Result Value Ref Range   TSH 1.080 0.350 - 4.500 uIU/mL    Comment: Performed by a 3rd Generation assay with a functional sensitivity of <=0.01 uIU/mL. Performed at [facility], [place]  [address], [place], [place] [address]   C-reactive protein     Status: Abnormal   Collection Time: 10/11/19 12:21 PM  Result Value Ref Range   CRP 1.2 (H) <1.0 mg/dL    Comment: Performed at [facility] [address] [place], [place] [address]  Sedimentation rate     Status: None   Collection Time:  10/11/19 12:21 PM  Result Value Ref Range   Sed Rate 1 0 - 16 mm/hr    Comment: Performed at [facility] [address], [place], [place] [address]    Current Facility-Administered Medications  Medication Dose Route Frequency Provider Last Rate Last Dose  . acetaminophen (TYLENOL) tablet 650 mg  650 mg Oral Q4H PRN Marykay Lex, MD      . atorvastatin (LIPITOR) tablet 40 mg  40 mg Oral q1800 Marykay Lex, MD      . colchicine tablet 0.6 mg  0.6 mg Oral BID Kroeger, Krista M., PA-C   0.6 mg at 10/11/19 1038  . feeding supplement (ENSURE ENLIVE) (ENSURE ENLIVE) liquid 237 mL  237 mL Oral BID BM Nahser, Wonda Cheng, MD      . heparin injection 5,000 Units  5,000 Units Subcutaneous Q8H Kroeger, Krista M., PA-C      . metoprolol tartrate (LOPRESSOR) tablet 12.5 mg  12.5 mg Oral BID Marykay Lex, MD   12.5 mg at 10/11/19 1037  . ondansetron (ZOFRAN) injection 4 mg  4 mg Intravenous Q6H PRN Marykay Lex, MD      . Derrill Memo ON 10/12/2019] sertraline (ZOLOFT) tablet 50 mg  50 mg Oral Daily Patrecia Pour, NP      . traZODone (DESYREL) tablet 200 mg  200 mg Oral QHS Patrecia Pour, NP        Musculoskeletal: Strength & Muscle Tone: within normal limits Gait & Station: normal Patient leans: N/A  Psychiatric Specialty Exam: Physical Exam  Nursing note and vitals reviewed. Constitutional: He is oriented to person, place, and time. He appears well-developed and well-nourished.  HENT:  Head: Normocephalic.  Neck: Normal range of motion.  Respiratory: Effort normal.  Musculoskeletal: Normal range of motion.  Neurological: He is alert and oriented to person, place, and time.   Psychiatric: His speech is normal and behavior is normal. Judgment and thought content normal. His mood appears anxious. His affect is blunt. Cognition and memory are normal.    Review of Systems  Psychiatric/Behavioral: The patient is nervous/anxious and has insomnia.   All other systems reviewed and are negative.   Blood pressure 126/68, pulse 77, temperature 97.9 F (36.6 C), temperature source Oral, resp. rate 18, height '5\' 11"'  (1.803 m), weight 70.9 kg, SpO2 98 %.Body mass index is 21.8 kg/m.  General Appearance: Casual  Eye Contact:  Good  Speech:  Normal Rate  Volume:  Normal  Mood:  Anxious  Affect:  Blunt  Thought Process:  Coherent and Descriptions of Associations: Intact  Orientation:  Full (Time, Place, and Person)  Thought Content:  WDL and Logical  Suicidal Thoughts:  No  Homicidal Thoughts:  No  Memory:  Immediate;   Good Recent;   Good Remote;   Good  Judgement:  Good  Insight:  Good  Psychomotor Activity:  Decreased  Concentration:  Concentration: Good and Attention Span: Good  Recall:  Good  Fund of Knowledge:  Good  Language:  Good  Akathisia:  No  Handed:  Right  AIMS (if indicated):     Assets:  Housing Leisure Time Physical Health Resilience Social Support  ADL's:  Intact  Cognition:  WNL  Sleep:      23 year old male admitted for cardiac irregularities and consult placed for medication assistance.  Patient reports an increase in anxiety and panic attacks after his Zoloft was stopped in the outpatient realm.  Restarted in the hospital and he  stabilized with some lingering anxiety and insomnia, treatment recommendations placed below  Treatment Plan Summary: Major depressive disorder, recurrent, mild and Anxiety: -Increase Zoloft from 25 mg daily to 50 mg daily  Insomnia: -Increase Trazodone from 100 mg to 150 mg daily at bedtime  Disposition: No evidence of imminent risk to self or others at present.    Waylan Boga, NP 10/11/2019 5:21 PM

## 2019-10-12 DIAGNOSIS — I319 Disease of pericardium, unspecified: Secondary | ICD-10-CM

## 2019-10-12 DIAGNOSIS — F411 Generalized anxiety disorder: Secondary | ICD-10-CM

## 2019-10-12 DIAGNOSIS — F410 Panic disorder [episodic paroxysmal anxiety] without agoraphobia: Secondary | ICD-10-CM

## 2019-10-12 LAB — CBC
HCT: 46.1 % (ref 39.0–52.0)
Hemoglobin: 16.2 g/dL (ref 13.0–17.0)
MCH: 31.2 pg (ref 26.0–34.0)
MCHC: 35.1 g/dL (ref 30.0–36.0)
MCV: 88.8 fL (ref 80.0–100.0)
Platelets: 219 10*3/uL (ref 150–400)
RBC: 5.19 MIL/uL (ref 4.22–5.81)
RDW: 12.1 % (ref 11.5–15.5)
WBC: 7.3 10*3/uL (ref 4.0–10.5)
nRBC: 0 % (ref 0.0–0.2)

## 2019-10-12 LAB — SARS CORONAVIRUS 2 (TAT 6-24 HRS): SARS Coronavirus 2: NEGATIVE

## 2019-10-12 MED ORDER — METOPROLOL TARTRATE 25 MG PO TABS: 12.5000 mg | ORAL_TABLET | Freq: Two times a day (BID) | ORAL | 6 refills | Status: DC

## 2019-10-12 MED ORDER — SERTRALINE HCL 50 MG PO TABS: 50.0000 mg | ORAL_TABLET | Freq: Every day | ORAL | 1 refills | Status: DC

## 2019-10-12 MED ORDER — COLCHICINE 0.6 MG PO TABS: 0.6000 mg | ORAL_TABLET | Freq: Two times a day (BID) | ORAL | 6 refills | Status: DC

## 2019-10-12 MED ORDER — ACETAMINOPHEN 325 MG PO TABS: 650.0000 mg | ORAL_TABLET | ORAL | Status: AC | PRN

## 2019-10-12 MED ORDER — TRAZODONE HCL 150 MG PO TABS: 150.0000 mg | ORAL_TABLET | Freq: Every day | ORAL | 1 refills | Status: DC

## 2019-10-12 NOTE — Progress Notes (Signed)
Discharge instructions (including medications) discussed with and copy provided to patient/caregiver 

## 2019-10-12 NOTE — Discharge Instructions (Signed)
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 You should avoid exercising for 6 months to avoid further heart damage.   Avoid use of over-the-counter anti-inflammatory medications (ibuprofen, motrin, naproxen, naprosyn, etc.) as these medications can weaken the heart muscle further. You can use over-the-counter tylenol (acetamenophen) as needed for mild aches/pains and fevers.   Your zoloft and trazadone were increased and a prescription was sent to your pharmacy for the updated dose. You will need to reach out to your outpatient psychiatrist for additional refills.   Continue to practice social distancing and mask wearing in public to limit exposure to COVID-19.

## 2019-10-21 ENCOUNTER — Inpatient Hospital Stay (HOSPITAL_COMMUNITY): Payer: Self-pay

## 2019-11-08 ENCOUNTER — Ambulatory Visit: Payer: Self-pay | Admitting: Physician Assistant

## 2019-12-22 ENCOUNTER — Encounter (HOSPITAL_COMMUNITY): Payer: Self-pay | Admitting: Licensed Clinical Social Worker

## 2019-12-22 ENCOUNTER — Other Ambulatory Visit: Payer: Self-pay

## 2019-12-22 ENCOUNTER — Ambulatory Visit (INDEPENDENT_AMBULATORY_CARE_PROVIDER_SITE_OTHER): Payer: No Typology Code available for payment source | Admitting: Licensed Clinical Social Worker

## 2019-12-22 DIAGNOSIS — F331 Major depressive disorder, recurrent, moderate: Secondary | ICD-10-CM | POA: Diagnosis not present

## 2019-12-22 DIAGNOSIS — F410 Panic disorder [episodic paroxysmal anxiety] without agoraphobia: Secondary | ICD-10-CM

## 2019-12-22 DIAGNOSIS — F411 Generalized anxiety disorder: Secondary | ICD-10-CM | POA: Diagnosis not present

## 2019-12-22 NOTE — Progress Notes (Signed)
Comprehensive Clinical Assessment (CCA) Note  12/22/2019 Keith Parks [id number]  Visit Diagnosis:      ICD-10-CM   1. Panic attacks  F41.0   2. MDD (major depressive disorder), recurrent episode, moderate (HCC)  F33.1   3. GAD (generalized anxiety disorder)  F41.1       CCA Part One  Part One has been completed on paper by the patient.  (See scanned document in Chart Review)  CCA Part Two A  Intake/Chief Complaint:  CCA Intake With Chief Complaint CCA Part Two Date: 12/22/19 CCA Part Two Time: 1317 Chief Complaint/Presenting Problem: [facility] Panic [facility],  MDD, GAD. 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 Patients Currently Reported Symptoms/Problems: panic attacks, body tenses, sweating, shaking, face turning red, depressive symptoms, anxiety symptoms Collateral Involvement: [facility] referral Individual's Strengths: motivated Individual's Preferences: Op therapy and medication managment Individual's Abilities: ability to participate in therapy Type of Services Patient Feels Are Needed: unsure  Mental Health Symptoms Depression:  Depression: Change in energy/activity, Difficulty Concentrating, Fatigue, Hopelessness, Worthlessness, Increase/decrease in appetite, Irritability, Tearfulness  Mania:     Anxiety:   Anxiety: Difficulty concentrating, Irritability, Restlessness, Worrying, Tension  Psychosis:     Trauma:  Trauma: N/A  Obsessions:     Compulsions:  Compulsions: "Driven" to perform behaviors/acts, Disrupts with routine/functioning, Intrusive/time consuming  Inattention:  Inattention: N/A  Hyperactivity/Impulsivity:  Hyperactivity/Impulsivity: N/A  Oppositional/Defiant Behaviors:     Borderline Personality:  Emotional Irregularity: N/A  Other Mood/Personality Symptoms:      Mental Status Exam Appearance and self-care  Stature:  Stature:  Average  Weight:  Weight: Average weight  Clothing:  Clothing: Casual  Grooming:  Grooming: Normal  Cosmetic use:  Cosmetic Use: None  Posture/gait:  Posture/Gait: Normal  Motor activity:  Motor Activity: Not Remarkable  Sensorium  Attention:  Attention: Normal  Concentration:  Concentration: Scattered  Orientation:  Orientation: X5  Recall/memory:  Recall/Memory: Defective in short-term  Affect and Mood  Affect:  Affect: Depressed  Mood:  Mood: Depressed  Relating  Eye contact:  Eye Contact: Normal  Facial expression:  Facial Expression: Depressed  Attitude toward examiner:  Attitude Toward Examiner: Cooperative  Thought and Language  Speech flow: Speech Flow: Normal  Thought content:  Thought Content: Appropriate to mood and circumstances  Preoccupation:  Preoccupations: Ruminations  Hallucinations:     Organization:     Company secretary of Knowledge:  Fund of Knowledge: Impoverished by:  (Comment)  Intelligence:  Intelligence: Average  Abstraction:  Abstraction: Functional  Judgement:  Judgement: Fair  Dance movement psychotherapist:  Reality Testing: Adequate  Insight:  Insight: Fair  Decision Making:  Decision Making: Normal  Social Functioning  Social Maturity:  Social Maturity: Isolates  Social Judgement:  Social Judgement: Normal  Stress  Stressors:  Stressors: Illness, Money(my mental illness)  Coping Ability:  Coping Ability: Deficient supports  Skill Deficits:     Supports:      Family and Psychosocial History: Family history Marital status: Separated Separated, when?: june 2020 What types of issues is patient dealing with in the relationship?: no relationship, no communication Additional relationship information: arguing during marriage, 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 Does patient have children?: No  Childhood History:  Childhood History By whom was/is the patient raised?: Mother, Father Additional childhood history information: raised by mom until 1st grade and  then moved in with dad at 1st grade  until i graduated high school Description of patient's relationship with caregiver when they were a child: I don't remember much about my relationship with my mother, I don't remember much about relationship with dad, kind of hazy, 1 sister was with me and dad Patient's description of current relationship with people who raised him/her: I don't see my mother or talk much, I see father on occassion How were you disciplined when you got in trouble as a child/adolescent?: whoopings Does patient have siblings?: Yes Number of Siblings: 3 Description of patient's current relationship with siblings: i don't see my sisters much. 1 sister in [place], 2 sisters in [place] Did patient suffer any verbal/emotional/physical/sexual abuse as a child?: No Did patient suffer from severe childhood neglect?: No Has patient ever been sexually abused/assaulted/raped as an adolescent or adult?: No Was the patient ever a victim of a crime or a disaster?: No Witnessed domestic violence?: No Has patient been effected by domestic violence as an adult?: No  CCA Part Two B  Employment/Work Situation: Employment / Work Scientist, research (life sciences) is the longest time patient has a held a job?: 2 years Where was the patient employed at that time?: [employer] Did You Receive Any Psychiatric Treatment/Services 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 Type of Psychiatric Treatment/Services in [employer]: therapy and psychiatry Are There Guns or Other Weapons in [place]?: No  Education: Education Last Grade Completed: 12 Name of [place]: [employer] Did Teacher, adult education From [employer]?: Yes Did Physicist, medical?: No Did Heritage manager?: No Did You Have An Individualized Education Program (IIEP): No Did You Have Any Difficulty At [employer]?: No  Religion: Religion/Spirituality Are You A Religious Person?: No  Leisure/Recreation: Leisure / Recreation Leisure and Hobbies:  Skateboarding and basketball, video games, hang out friends, nature  Exercise/Diet: Exercise/Diet Do You Exercise?: No Have You Gained or Lost A Significant Amount of Weight in the Past Six Months?: No Do You Follow a Special Diet?: No Do You Have Any Trouble Sleeping?: Yes Explanation of Sleeping Difficulties: going to sleep, sleep too much and can't get up  CCA Part Two C  Alcohol/Drug Use: Alcohol / Drug Use History of alcohol / drug use?: Yes Substance #1 Name of Substance 1: marijuana, quit smoking 11/20 for a few months 2x per day. Alcohol drinks 2-3 x per week  4 cans Mike's hard lemonade                    CCA Part Three  ASAM's:  Six Dimensions of Multidimensional Assessment  Dimension 1:  Acute Intoxication and/or Withdrawal Potential:     Dimension 2:  Biomedical Conditions and Complications:     Dimension 3:  Emotional, Behavioral, or Cognitive Conditions and Complications:     Dimension 4:  Readiness to Change:     Dimension 5:  Relapse, Continued use, or Continued Problem Potential:     Dimension 6:  Recovery/Living Environment:      Substance use Disorder (SUD)    Social Function:  Social Functioning Social Maturity: Isolates Social Judgement: Normal  Stress:  Stress Stressors: Illness, Money(my mental illness) Coping Ability: Deficient supports Patient Takes Medications The Way The Doctor Instructed?: Yes Priority Risk: Low Acuity  Risk Assessment- Self-Harm Potential: Risk Assessment For Self-Harm Potential Thoughts of Self-Harm: Vague current thoughts Method: Plan without intent Availability of Means: No access/NA Additional Comments for Self-Harm Potential: I tried to commit suicide a week ago by taking trazadone pills and then passed out  Risk Assessment -Dangerous to Others Potential: Risk Assessment For Dangerous to Others Potential Method: No Plan Availability of Means: No access or NA Intent: Vague intent or NA Notification  Required: No need or identified person  DSM5 Diagnoses: Patient Active Problem List   Diagnosis Date Noted  . Myopericarditis 10/12/2019  . Generalized anxiety disorder with panic attacks 09/28/2019  . Major depressive disorder, recurrent episode, moderate (HCC)   . Suicidal ideation     Patient Centered Plan: Patient is on the following Treatment Plan(s):  Depression, anxiety, panic  Recommendations for Services/Supports/Treatments: Recommendations for Services/Supports/Treatments Recommendations For Services/Supports/Treatments: Individual Therapy, Medication Management  Treatment Plan Summary: OP Treatment Plan Summary: I don't want to have so many panic attacks which leads to increased depression  Referrals to Alternative Service(s): Referred to Alternative Service(s):   Place:   Date:   Time:    Referred to Alternative Service(s):   Place:   Date:   Time:    Referred to Alternative Service(s):   Place:   Date:   Time:    Referred to Alternative Service(s):   Place:   Date:   Time:     Vernona Rieger

## 2019-12-27 ENCOUNTER — Other Ambulatory Visit: Payer: Self-pay

## 2019-12-27 ENCOUNTER — Encounter (HOSPITAL_COMMUNITY): Payer: Self-pay | Admitting: Licensed Clinical Social Worker

## 2019-12-27 ENCOUNTER — Ambulatory Visit (INDEPENDENT_AMBULATORY_CARE_PROVIDER_SITE_OTHER): Payer: No Typology Code available for payment source | Admitting: Licensed Clinical Social Worker

## 2019-12-27 DIAGNOSIS — F411 Generalized anxiety disorder: Secondary | ICD-10-CM | POA: Diagnosis not present

## 2019-12-27 DIAGNOSIS — F410 Panic disorder [episodic paroxysmal anxiety] without agoraphobia: Secondary | ICD-10-CM | POA: Diagnosis not present

## 2019-12-27 DIAGNOSIS — F331 Major depressive disorder, recurrent, moderate: Secondary | ICD-10-CM

## 2019-12-27 NOTE — Progress Notes (Signed)
Virtual Visit via Video Note  I connected with Keith Parks on 12/27/19 at  2:00 PM EST by a video enabled telemedicine application and verified that I am speaking with the correct person using two identifiers.   I discussed the limitations of evaluation and management by telemedicine and the availability of in person appointments. The patient expressed understanding and agreed to proceed.  History of Present Illness: [facility], depression and anxiety.    Observations/Objective:Patient presents depressed for his initial counseling webex session. Patient described his psychiatric symptoms and current life events. Spent a considerable amount of time building a trusting therapeutic relationship.Patient reports of continued panic attacks. Used socratic questions trying to pinpoint triggers and coping skills. Suggested coping skills and coached patient: adult coloring books, adult connect the dots, coloring app for phone, visualization of happy place, meditation for panic on youtube.   Assessment and Plan: Counselor will continue to meet with patient to address treatment plan goals. Patient will continue to follow recommendations of providers and implement skills learned in session.    Follow Up Instructions: I discussed the assessment and treatment plan with the patient. The patient was provided an opportunity to ask questions and all were answered. The patient agreed with the plan and demonstrated an understanding of the instructions.   The patient was advised to call back or seek an in-person evaluation if the symptoms worsen or if the condition fails to improve as anticipated.  I provided 60 minutes of non-face-to-face time during this encounter.   Rosie Torrez S, LCAS

## 2020-01-06 ENCOUNTER — Ambulatory Visit (HOSPITAL_COMMUNITY): Payer: No Typology Code available for payment source | Admitting: Licensed Clinical Social Worker

## 2020-01-15 ENCOUNTER — Ambulatory Visit (HOSPITAL_COMMUNITY): Payer: No Typology Code available for payment source | Admitting: Psychiatry

## 2020-01-19 ENCOUNTER — Encounter (HOSPITAL_COMMUNITY): Payer: Self-pay | Admitting: Licensed Clinical Social Worker

## 2020-01-19 ENCOUNTER — Other Ambulatory Visit: Payer: Self-pay

## 2020-01-19 ENCOUNTER — Ambulatory Visit (INDEPENDENT_AMBULATORY_CARE_PROVIDER_SITE_OTHER): Payer: No Typology Code available for payment source | Admitting: Licensed Clinical Social Worker

## 2020-01-19 DIAGNOSIS — F331 Major depressive disorder, recurrent, moderate: Secondary | ICD-10-CM

## 2020-01-19 DIAGNOSIS — F419 Anxiety disorder, unspecified: Secondary | ICD-10-CM | POA: Diagnosis not present

## 2020-01-19 DIAGNOSIS — R45851 Suicidal ideations: Secondary | ICD-10-CM | POA: Diagnosis not present

## 2020-01-19 DIAGNOSIS — F329 Major depressive disorder, single episode, unspecified: Secondary | ICD-10-CM

## 2020-01-19 DIAGNOSIS — F411 Generalized anxiety disorder: Secondary | ICD-10-CM

## 2020-01-19 DIAGNOSIS — F410 Panic disorder [episodic paroxysmal anxiety] without agoraphobia: Secondary | ICD-10-CM | POA: Diagnosis not present

## 2020-01-19 NOTE — Progress Notes (Signed)
Virtual Visit via Video Note  I connected with Keith Parks on 01/19/20 at  4:00 PM EST by a video enabled telemedicine application and verified that I am speaking with the correct person using two identifiers.   I discussed the limitations of evaluation and management by telemedicine and the availability of in person appointments. The patient expressed understanding and agreed to proceed.  History of Present Illness: [facility] attacks, depression and anxiety.    Observations/Objective:Patient presents depressed for his initial counseling webex session. Patient described his psychiatric symptoms and current life events. Patient continues to have panic attacks, which exacerbates his suicidal thoughts. Clinician utilized CBT to address thought processes. Clinician provided thought stopping tools, as well as reality testing to provide support and confidence in his decisions. Clinician explored emotional state and noted that having some crying spells was not an indication of being emotionally unwell.   Assessment and Plan: Counselor will continue to meet with patient to address treatment plan goals. Patient will continue to follow recommendations of providers and implement skills learned in session.    Follow Up Instructions: I discussed the assessment and treatment plan with the patient. The patient was provided an opportunity to ask questions and all were answered. The patient agreed with the plan and demonstrated an understanding of the instructions.   The patient was advised to call back or seek an in-person evaluation if the symptoms worsen or if the condition fails to improve as anticipated.  I provided 60 minutes of non-face-to-face time during this encounter.   Zakia Sainato S, LCAS

## 2020-01-29 ENCOUNTER — Ambulatory Visit (INDEPENDENT_AMBULATORY_CARE_PROVIDER_SITE_OTHER): Payer: No Typology Code available for payment source | Admitting: Psychiatry

## 2020-01-29 ENCOUNTER — Encounter (HOSPITAL_COMMUNITY): Payer: Self-pay | Admitting: Psychiatry

## 2020-01-29 DIAGNOSIS — F5102 Adjustment insomnia: Secondary | ICD-10-CM

## 2020-01-29 DIAGNOSIS — F410 Panic disorder [episodic paroxysmal anxiety] without agoraphobia: Secondary | ICD-10-CM | POA: Diagnosis not present

## 2020-01-29 DIAGNOSIS — F411 Generalized anxiety disorder: Secondary | ICD-10-CM | POA: Diagnosis not present

## 2020-01-29 DIAGNOSIS — F331 Major depressive disorder, recurrent, moderate: Secondary | ICD-10-CM

## 2020-01-29 MED ORDER — HYDROXYZINE HCL 25 MG PO TABS: 25.0000 mg | ORAL_TABLET | Freq: Every day | ORAL | 0 refills | Status: DC | PRN

## 2020-01-29 MED ORDER — SERTRALINE HCL 50 MG PO TABS: 75.0000 mg | ORAL_TABLET | Freq: Every day | ORAL | 1 refills | Status: DC
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
## 2020-02-12 ENCOUNTER — Ambulatory Visit (HOSPITAL_COMMUNITY): Payer: No Typology Code available for payment source | Admitting: Psychiatry

## 2020-02-15 ENCOUNTER — Other Ambulatory Visit: Payer: Self-pay

## 2020-02-15 ENCOUNTER — Ambulatory Visit (INDEPENDENT_AMBULATORY_CARE_PROVIDER_SITE_OTHER): Payer: No Typology Code available for payment source | Admitting: Licensed Clinical Social Worker

## 2020-02-15 ENCOUNTER — Encounter (HOSPITAL_COMMUNITY): Payer: Self-pay | Admitting: Licensed Clinical Social Worker

## 2020-02-15 DIAGNOSIS — F410 Panic disorder [episodic paroxysmal anxiety] without agoraphobia: Secondary | ICD-10-CM | POA: Diagnosis not present

## 2020-02-15 DIAGNOSIS — F331 Major depressive disorder, recurrent, moderate: Secondary | ICD-10-CM | POA: Diagnosis not present

## 2020-02-15 DIAGNOSIS — F411 Generalized anxiety disorder: Secondary | ICD-10-CM

## 2020-02-15 NOTE — Progress Notes (Signed)
Virtual Visit via Video Note  I connected with Keith Parks on 02/15/20 at  4:00 PM EDT by a video enabled telemedicine application and verified that I am speaking with the correct person using two identifiers.   I discussed the limitations of evaluation and management by telemedicine and the availability of in person appointments. The patient expressed understanding and agreed to proceed.  History of Present Illness: [facility], depression and anxiety.    Observations/Objective:Patient presents depressed for his counseling webex session. Patient described his psychiatric symptoms and current life events. Patient provided updates: panic attacks, depressive symptoms, suicidal thoughts, anxiety, sleep issues, work, family, girlfriend. Clinician utilized MI OARS to reflect and summarize thoughts and feelings about this new normal life he is experiencing. Clinician discussed the importance of focusing on the here and now, what he can control, rather than what he cannot. Patient reports he saw the psychiatrist who prescribed new medication for panic attacks. He sees the psychiatrist again in 2 weeks. Patient has his disability hearing with the military in 2 weeks. Role played questions that may be asked about mental illness during the hearing.      Assessment and Plan: Counselor will continue to meet with patient to address treatment plan goals. Patient will continue to follow recommendations of providers and implement skills learned in session.    Follow Up Instructions: I discussed the assessment and treatment plan with the patient. The patient was provided an opportunity to ask questions and all were answered. The patient agreed with the plan and demonstrated an understanding of the instructions.   The patient was advised to call back or seek an in-person evaluation if the symptoms worsen or if the condition fails to  improve as anticipated.  I provided 60 minutes of non-face-to-face time during this encounter.   Judyth Demarais S, LCAS

## 2020-02-29 ENCOUNTER — Encounter (HOSPITAL_COMMUNITY): Payer: Self-pay | Admitting: Psychiatry

## 2020-02-29 ENCOUNTER — Ambulatory Visit (INDEPENDENT_AMBULATORY_CARE_PROVIDER_SITE_OTHER): Payer: No Typology Code available for payment source | Admitting: Psychiatry

## 2020-02-29 DIAGNOSIS — F331 Major depressive disorder, recurrent, moderate: Secondary | ICD-10-CM | POA: Diagnosis not present

## 2020-02-29 DIAGNOSIS — F410 Panic disorder [episodic paroxysmal anxiety] without agoraphobia: Secondary | ICD-10-CM

## 2020-02-29 DIAGNOSIS — F411 Generalized anxiety disorder: Secondary | ICD-10-CM | POA: Diagnosis not present

## 2020-02-29 MED ORDER — BUSPIRONE HCL 7.5 MG PO TABS: 7.5000 mg | ORAL_TABLET | Freq: Three times a day (TID) | ORAL | 0 refills | Status: DC

## 2020-02-29 MED ORDER — TRAZODONE HCL 150 MG PO TABS: 150.0000 mg | ORAL_TABLET | Freq: Every day | ORAL | 1 refills | Status: DC

## 2020-02-29 MED ORDER — SERTRALINE HCL 50 MG PO TABS: 150.0000 mg | ORAL_TABLET | Freq: Every day | ORAL | 0 refills | Status: DC
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
## 2020-03-13 ENCOUNTER — Ambulatory Visit (INDEPENDENT_AMBULATORY_CARE_PROVIDER_SITE_OTHER): Payer: No Typology Code available for payment source | Admitting: Licensed Clinical Social Worker

## 2020-03-13 ENCOUNTER — Other Ambulatory Visit: Payer: Self-pay

## 2020-03-13 ENCOUNTER — Encounter (HOSPITAL_COMMUNITY): Payer: Self-pay | Admitting: Licensed Clinical Social Worker

## 2020-03-13 DIAGNOSIS — F411 Generalized anxiety disorder: Secondary | ICD-10-CM

## 2020-03-13 DIAGNOSIS — F410 Panic disorder [episodic paroxysmal anxiety] without agoraphobia: Secondary | ICD-10-CM | POA: Diagnosis not present

## 2020-03-13 DIAGNOSIS — F331 Major depressive disorder, recurrent, moderate: Secondary | ICD-10-CM

## 2020-03-13 NOTE — Progress Notes (Signed)
Virtual Visit via Video Note  I connected with Keith Parks on 03/13/20 at  4:00 PM EDT by a video enabled telemedicine application and verified that I am speaking with the correct person using two identifiers.   I discussed the limitations of evaluation and management by telemedicine and the availability of in person appointments. The patient expressed understanding and agreed to proceed.  History of Present Illness: [facility], depression and anxiety.    Observations/Objective:Patient presents depressed for his counseling webex session. Patient described his psychiatric symptoms and current life events. Patient provided updates: panic attacks, depressive symptoms, suicidal thoughts, anxiety, sleep issues, work, family, girlfriend. Patient reports he saw Dr. Fredda Hammed who increased 1 medication and added Buspar. He still wants something to help his panic attacks immediately. Patient reports he has his disability hearing tomorrow. Discussed possibilities of questions to be asked. Cln provided psychotherapy on change which means him letting go of dysfunctional relationship patterns, his irrational beliefs and self-sabotaging behaviors. Assisted patient with learning to replace them with a more positive, conscious and proactive approach to change.Will continue to see patient 1x per month.   Assessment and Plan: Counselor will continue to meet with patient to address treatment plan goals. Patient will continue to follow recommendations of providers and implement skills learned in session.    Follow Up Instructions: I discussed the assessment and treatment plan with the patient. The patient was provided an opportunity to ask questions and all were answered. The patient agreed with the plan and demonstrated an understanding of the instructions.   The patient was advised to call back or seek an in-person evaluation if the symptoms  worsen or if the condition fails to improve as anticipated.  I provided 45 minutes of non-face-to-face time during this encounter.   Undrea Shipes S, LCAS

## 2020-03-24 IMAGING — MR MR CARD MORPHOLOGY WO/W CM
35 of 37 series · 36 of 40 positions shown · IV contrast (gadavist)
Comparison: none

CLINICAL DATA: Concern for myocarditis

EXAM:
CARDIAC MRI
TECHNIQUE: The patient was scanned on a 1.5 Tesla Siemens magnet. A dedicated
cardiac coil was used. Functional imaging was done using Fiesta
sequences. [DATE], and 4 chamber views were done to assess for RWMA's.
Modified Oxendine rule using a short axis stack was used to
calculate an ejection fraction on a dedicated work station using
Circle software. The patient received 10 cc of Gadavist. After 10
minutes inversion recovery sequences were used to assess for
infiltration and scar tissue.
CONTRAST:  10 cc  of Gadavist

[Series 6: bSSFP · oblique · 8.0mm · 1.61mm/px · 1 of 25 slices shown (1 of 20)]
[im 1/25]
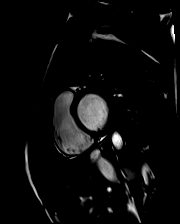

[Series 6: bSSFP · oblique · 8.0mm · 1.61mm/px · 1 of 25 slices shown (2 of 20)]
[im 1/25]
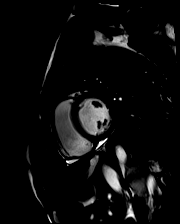

[Series 6: bSSFP · oblique · 8.0mm · 1.61mm/px · 1 of 25 slices shown (3 of 20)]
[im 1/25]
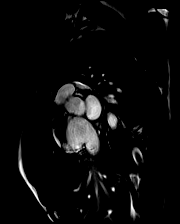

[Series 6: bSSFP · oblique · 8.0mm · 1.61mm/px · 1 of 25 slices shown (4 of 20)]
[im 1/25]
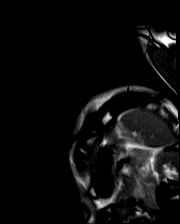

[Series 6: bSSFP · oblique · 8.0mm · 1.61mm/px · 1 of 25 slices shown (5 of 20)]
[im 1/25]
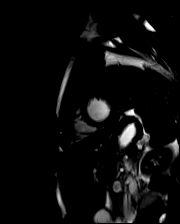

[Series 6: bSSFP · oblique · 8.0mm · 1.61mm/px · 1 of 25 slices shown (6 of 20)]
[im 1/25]
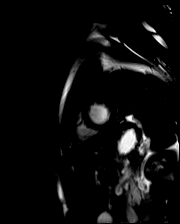

[Series 6: bSSFP · oblique · 8.0mm · 1.61mm/px · 1 of 25 slices shown (7 of 20)]
[im 1/25]
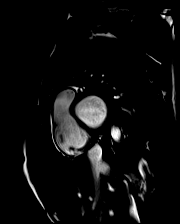

[Series 6: bSSFP · oblique · 8.0mm · 1.61mm/px · 1 of 25 slices shown (8 of 20)]
[im 1/25]
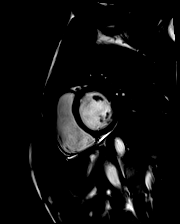

[Series 6: bSSFP · oblique · 8.0mm · 1.61mm/px · 1 of 25 slices shown (9 of 20)]
[im 1/25]
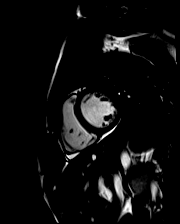

[Series 6: bSSFP · oblique · 8.0mm · 1.61mm/px · 1 of 25 slices shown (10 of 20)]
[im 1/25]
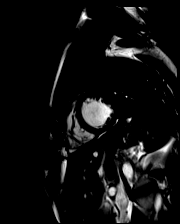

[Series 6: bSSFP · oblique · 8.0mm · 1.61mm/px · 1 of 25 slices shown (11 of 20)]
[im 1/25]
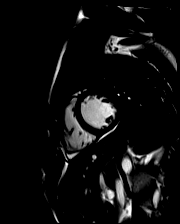

[Series 6: bSSFP · oblique · 8.0mm · 1.61mm/px · 1 of 25 slices shown (12 of 20)]
[im 1/25]
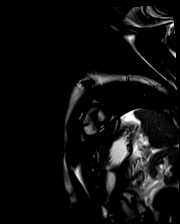

[Series 6: bSSFP · oblique · 8.0mm · 1.61mm/px · 1 of 25 slices shown (13 of 20)]
[im 1/25]
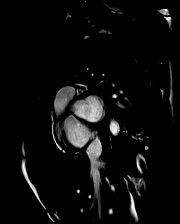

[Series 6: bSSFP · oblique · 8.0mm · 1.61mm/px · 1 of 25 slices shown (14 of 20)]
[im 1/25]
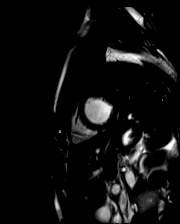

[Series 6: bSSFP · oblique · 8.0mm · 1.61mm/px · 1 of 25 slices shown (15 of 20)]
[im 1/25]
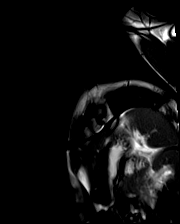

[Series 6: bSSFP · oblique · 8.0mm · 1.61mm/px · 1 of 25 slices shown (16 of 20)]
[im 1/25]
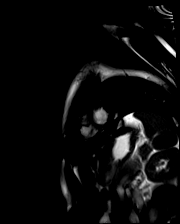

[Series 6: bSSFP · oblique · 8.0mm · 1.61mm/px · 2 of 25 slices shown (17 of 20)]
[im 1/25]
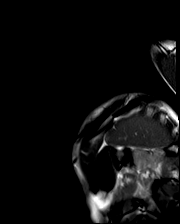
[im 25/25]
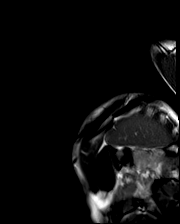

[Series 7: t2_stir_db_sax · oblique · 8.0mm · 1.73mm/px · 1 of 17 slices shown]
[im 1/17]
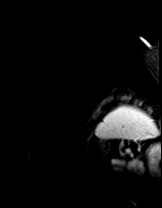

[Series 8: t2_stir_db_radial ((date)ch) · oblique · 6.0mm · 1.73mm/px · 1 of 3 slices shown]
[im 1/3]
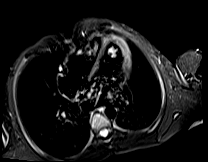

[Series 9: bSSFP · oblique · 6.0mm · 1.41mm/px · 1 of 25 slices shown (18 of 20)]
[im 1/25]
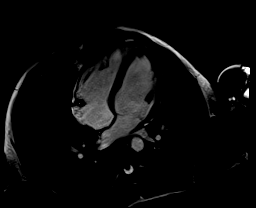

[Series 10: bSSFP · coronal · 6.0mm · 1.41mm/px · 1 of 25 slices shown (19 of 20)]
[im 1/25]
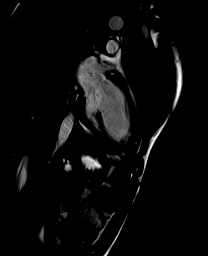

[Series 11: bSSFP · oblique · 6.0mm · 1.41mm/px · 1 of 25 slices shown (20 of 20)]
[im 1/25]
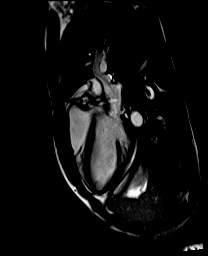

[Series 13: (id)_long_t1 · oblique · 8.0mm · 1.41mm/px · 1 of 24 slices shown]
[im 1/24]
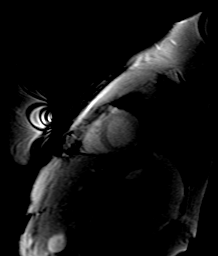

[Series 14: (id)_long_t1_moco · oblique · 8.0mm · 1.41mm/px · 1 of 24 slices shown]
[im 1/24]
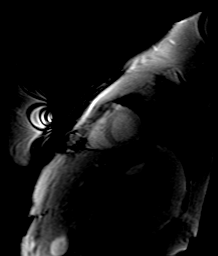

[Series 15: (id)_long_t1_moco_t1 · oblique · 8.0mm · 1.41mm/px · 1 of 6 slices shown]
[im 1/6]
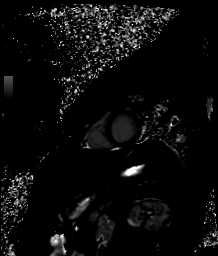

[Series 17: (id)_trufi · oblique · 8.0mm · 1.88mm/px · 1 of 9 slices shown]
[im 1/9]
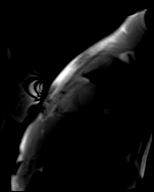

[Series 18: (id)_trufi_moco · oblique · 8.0mm · 1.88mm/px · 1 of 9 slices shown]
[im 1/9]
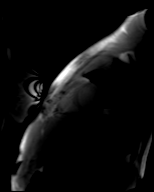

[Series 19: (id)_trufi_moco_t2 · oblique · 8.0mm · 1.88mm/px · 1 of 3 slices shown]
[im 1/3]
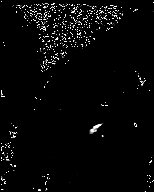

[Series 22: lge_single shot sa · oblique · 8.0mm · 1.98mm/px · 1 of 17 slices shown (1 of 2)]
[im 1/17]
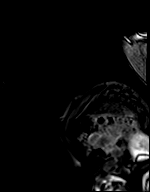

[Series 23: lge_single shot sa · oblique · 8.0mm · 1.98mm/px · 1 of 17 slices shown (2 of 2)]
[im 1/17]
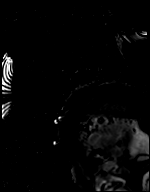

[Series 31: (id)_short_t1 · oblique · 8.0mm · 1.41mm/px · 1 of 27 slices shown]
[im 1/27]
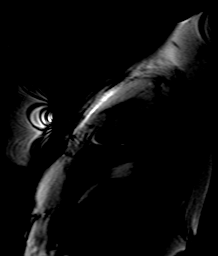

[Series 32: (id)_short_t1_moco · oblique · 8.0mm · 1.41mm/px · 1 of 27 slices shown]
[im 1/27]
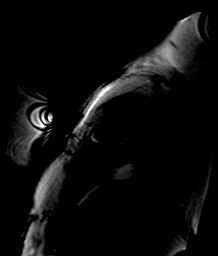

[Series 33: (id)_short_t1_moco_t1 · oblique · 8.0mm · 1.41mm/px · 1 of 6 slices shown]
[im 1/6]
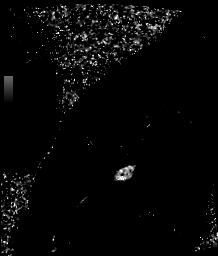

[Series 36: lge short axis_mag · oblique · 8.0mm · 1.61mm/px · 1 of 17 slices shown]
[im 1/17]
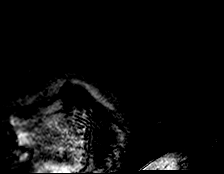

[Series 37: lge short axis_psir · oblique · 8.0mm · 1.61mm/px · 1 of 17 slices shown]
[im 1/17]
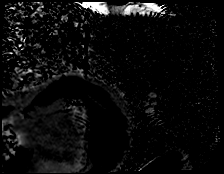

[36 of 40 positions shown; findings below may reference images not displayed]

FINDINGS: Left ventricle:

- Mild dilatation

- Mild systolic dysfunction. Focal hypokinesis in mid inferolateral
wall

- Focal elevations in native T1/ECV and T2 along the lateral wall

- Subepicardial LGE in the inferior and lateral walls

LV EF: 51% (Normal 56-78%)

Absolute volumes:

LV EDV: 188mL (Normal 77-195 mL)

LV ESV: 93mL (Normal 19-72 mL)

LV SV: 96mL (Normal 51-133 mL)

CO: 5.5L/min (Normal 2.8-8.8 L/min)

Indexed volumes:

LV EDV: 100mL/sq-m (Normal 47-92 mL/sq-m)

LV ESV: 49mL/sq-m (Normal 13-30 mL/sq-m)

LV SV: 51mL/sq-m (Normal 32-62 mL/sq-m)

CI: 2.9L/min/sq-m (Normal 1.7-4.2 L/min/sq-m)

Right ventricle: Mild dilatation.  Mild systolic dysfunction

RV EF:  46% (Normal 47-74%)

Absolute volumes:

RV EDV: 213mL (Normal 88-227 mL)

RV ESV: 115mL (Normal 23-103 mL)

RV SV: 98mL (Normal 52-138 mL)

CO: 5.6L/min (Normal 2.8-8.8 L/min)

Indexed volumes:

RV EDV: 113mL/sq-m (Normal 55-105 mL/sq-m)

RV ESV: 61mL/sq-m (Normal 15-43 mL/sq-m)

RV SV: 52mL/sq-m (Normal 32-64 mL/sq-m)

CI: 2.9L/min/sq-m (Normal 1.7-4.2 L/min/sq-m)

Left atrium: Normal size

Right atrium: Mild enlargement

Mitral valve: No regurgitation

Aortic valve: Not well visualized

Tricuspid valve: No regurgitation

Pericardium: Trace effusion. Likely LGE of visceral pericardium
along lateral wall
IMPRESSION: 1. Subepicardial late gadolinium enhancement along the lateral and
inferior walls. Also with focal areas of elevated native T1, T2, and
ECV in the lateral wall. These findings are consistent with an acute
myocarditis.

2. Likely delayed enhancement of visceral pericardium along lateral
wall, suggesting component of pericarditis as well. Trace
pericardial effusion.

3. Mild LV dilatation with mild systolic dysfunction (EF 51%). Focal
hypokinesis in mid inferolateral wall

4.  Mild RV dilatation with mild systolic dysfunction (EF 46%)

## 2020-04-04 ENCOUNTER — Encounter (HOSPITAL_COMMUNITY): Payer: Self-pay | Admitting: Psychiatry

## 2020-04-04 ENCOUNTER — Telehealth (INDEPENDENT_AMBULATORY_CARE_PROVIDER_SITE_OTHER): Payer: No Typology Code available for payment source | Admitting: Psychiatry

## 2020-04-04 DIAGNOSIS — F411 Generalized anxiety disorder: Secondary | ICD-10-CM | POA: Diagnosis not present

## 2020-04-04 DIAGNOSIS — F410 Panic disorder [episodic paroxysmal anxiety] without agoraphobia: Secondary | ICD-10-CM

## 2020-04-04 DIAGNOSIS — F331 Major depressive disorder, recurrent, moderate: Secondary | ICD-10-CM | POA: Diagnosis not present

## 2020-04-04 MED ORDER — SERTRALINE HCL 50 MG PO TABS: 150.0000 mg | ORAL_TABLET | Freq: Every day | ORAL | 0 refills | Status: DC

## 2020-04-04 MED ORDER — BUSPIRONE HCL 10 MG PO TABS: 10.0000 mg | ORAL_TABLET | Freq: Two times a day (BID) | ORAL | 1 refills | Status: DC
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
## 2020-04-12 ENCOUNTER — Ambulatory Visit (INDEPENDENT_AMBULATORY_CARE_PROVIDER_SITE_OTHER): Payer: No Typology Code available for payment source | Admitting: Licensed Clinical Social Worker

## 2020-04-12 ENCOUNTER — Other Ambulatory Visit: Payer: Self-pay

## 2020-04-12 ENCOUNTER — Encounter (HOSPITAL_COMMUNITY): Payer: Self-pay | Admitting: Licensed Clinical Social Worker

## 2020-04-12 DIAGNOSIS — F331 Major depressive disorder, recurrent, moderate: Secondary | ICD-10-CM

## 2020-04-12 DIAGNOSIS — F411 Generalized anxiety disorder: Secondary | ICD-10-CM | POA: Diagnosis not present

## 2020-04-12 DIAGNOSIS — F410 Panic disorder [episodic paroxysmal anxiety] without agoraphobia: Secondary | ICD-10-CM

## 2020-04-12 NOTE — Progress Notes (Addendum)
Virtual Visit via Video Note  I connected with Hulda Marin on 04/12/20 at  4:00 PM EDT by a video enabled telemedicine application and verified that I am speaking with the correct person using two identifiers.   I discussed the limitations of evaluation and management by telemedicine and the availability of in person appointments. The patient expressed understanding and agreed to proceed.  LOCATION: Patient: Home Provider: Home  History of Present Illness: [facility], depression and anxiety.    Observations/Objective:Patient presents depressed for his counseling webex session. Patient described his psychiatric symptoms and current life events. Reviewed treatment plan with patient who verbalized acceptance of the plan. Patient provided updates: panic attacks, depressive symptoms, suicidal thoughts, anxiety, sleep issues, work, family, girlfriend. Patient reports he recently saw Dr. Fredda Hammed who increased 1 medication. He still wants something to help his panic attacks immediately. Patient reports he received disability rating 60%. He will appeal the decision for a higher rating. Patient reports "I'm overthinking things in my relationship." Cln provided psychoeducation on overthinking, challenging negative thoughts, "just the facts" Reviewed worksheet on challenging negative thoughts/irrational beliefs.  Will continue to see patient 1x per month.  PLAN: sign tx plan from last visit  Assessment and Plan: Counselor will continue to meet with patient to address treatment plan goals. Patient will continue to follow recommendations of providers and implement skills learned in session.    Follow Up Instructions: I discussed the assessment and treatment plan with the patient. The patient was provided an opportunity to ask questions and all were answered. The patient agreed with the plan and demonstrated an understanding of the  instructions.   The patient was advised to call back or seek an in-person evaluation if the symptoms worsen or if the condition fails to improve as anticipated.  I provided 45 minutes of non-face-to-face time during this encounter.   Juanjesus Pepperman S, LCAS

## 2020-04-14 ENCOUNTER — Encounter (HOSPITAL_COMMUNITY): Payer: Self-pay | Admitting: Emergency Medicine

## 2020-04-14 ENCOUNTER — Other Ambulatory Visit: Payer: Self-pay

## 2020-04-14 ENCOUNTER — Emergency Department (HOSPITAL_COMMUNITY)
Admission: EM | Admit: 2020-04-14 | Discharge: 2020-04-14 | Disposition: A | Discharge status: Home / Self Care | Payer: No Typology Code available for payment source | Attending: Emergency Medicine | Admitting: Emergency Medicine

## 2020-04-14 DIAGNOSIS — Z5321 Procedure and treatment not carried out due to patient leaving prior to being seen by health care provider: Secondary | ICD-10-CM | POA: Diagnosis not present

## 2020-04-14 DIAGNOSIS — R197 Diarrhea, unspecified: Secondary | ICD-10-CM | POA: Diagnosis not present

## 2020-04-14 DIAGNOSIS — R112 Nausea with vomiting, unspecified: Secondary | ICD-10-CM | POA: Diagnosis present

## 2020-04-14 HISTORY — DX: Essential (primary) hypertension: I10

## 2020-04-14 LAB — CBC
HCT: 48.6 % (ref 39.0–52.0)
Hemoglobin: 17 g/dL (ref 13.0–17.0)
MCH: 33.3 pg (ref 26.0–34.0)
MCHC: 35 g/dL (ref 30.0–36.0)
MCV: 95.1 fL (ref 80.0–100.0)
Platelets: 264 10*3/uL (ref 150–400)
RBC: 5.11 MIL/uL (ref 4.22–5.81)
RDW: 12.4 % (ref 11.5–15.5)
WBC: 8.7 10*3/uL (ref 4.0–10.5)
nRBC: 0 % (ref 0.0–0.2)

## 2020-04-14 LAB — COMPREHENSIVE METABOLIC PANEL
ALT: 22 U/L (ref 0–44)
AST: 18 U/L (ref 15–41)
Albumin: 4.9 g/dL (ref 3.5–5.0)
Alkaline Phosphatase: 81 U/L (ref 38–126)
Anion gap: 10 (ref 5–15)
BUN: 12 mg/dL (ref 6–20)
CO2: 30 mmol/L (ref 22–32)
Calcium: 9.2 mg/dL (ref 8.9–10.3)
Chloride: 99 mmol/L (ref 98–111)
Creatinine, Ser: 1 mg/dL (ref 0.61–1.24)
GFR calc Af Amer: >60 mL/min (ref >60)
GFR calc non Af Amer: >60 mL/min (ref >60)
Glucose, Bld: 98 mg/dL (ref 70–99)
Potassium: 3.7 mmol/L (ref 3.5–5.1)
Sodium: 139 mmol/L (ref 135–145)
Total Bilirubin: 1.3 mg/dL — ABNORMAL HIGH (ref 0.3–1.2)
Total Protein: 7.9 g/dL (ref 6.5–8.1)

## 2020-04-14 LAB — TYPE AND SCREEN
ABO/RH(D): O POS
Antibody Screen: NEGATIVE

## 2020-04-14 NOTE — ED Triage Notes (Signed)
Pt c/o of vomiting coffee ground emesis x 3 months with n/v/d and heartburn.  Denies any abdominal pain.

## 2020-05-02 ENCOUNTER — Telehealth (INDEPENDENT_AMBULATORY_CARE_PROVIDER_SITE_OTHER): Payer: No Typology Code available for payment source | Admitting: Psychiatry

## 2020-05-02 ENCOUNTER — Encounter (HOSPITAL_COMMUNITY): Payer: Self-pay | Admitting: Psychiatry

## 2020-05-02 DIAGNOSIS — F410 Panic disorder [episodic paroxysmal anxiety] without agoraphobia: Secondary | ICD-10-CM | POA: Diagnosis not present

## 2020-05-02 DIAGNOSIS — F5102 Adjustment insomnia: Secondary | ICD-10-CM

## 2020-05-02 DIAGNOSIS — F331 Major depressive disorder, recurrent, moderate: Secondary | ICD-10-CM | POA: Diagnosis not present

## 2020-05-02 MED ORDER — BUSPIRONE HCL 10 MG PO TABS: 10.0000 mg | ORAL_TABLET | Freq: Three times a day (TID) | ORAL | 1 refills | Status: DC | PRN
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
## 2020-05-15 ENCOUNTER — Other Ambulatory Visit: Payer: Self-pay

## 2020-05-15 ENCOUNTER — Ambulatory Visit (INDEPENDENT_AMBULATORY_CARE_PROVIDER_SITE_OTHER): Payer: No Typology Code available for payment source | Admitting: Licensed Clinical Social Worker

## 2020-05-15 ENCOUNTER — Encounter (HOSPITAL_COMMUNITY): Payer: Self-pay | Admitting: Licensed Clinical Social Worker

## 2020-05-15 ENCOUNTER — Telehealth (HOSPITAL_COMMUNITY): Payer: Self-pay | Admitting: Psychiatry

## 2020-05-15 DIAGNOSIS — F410 Panic disorder [episodic paroxysmal anxiety] without agoraphobia: Secondary | ICD-10-CM

## 2020-05-15 DIAGNOSIS — F411 Generalized anxiety disorder: Secondary | ICD-10-CM

## 2020-05-15 DIAGNOSIS — F331 Major depressive disorder, recurrent, moderate: Secondary | ICD-10-CM

## 2020-05-15 MED ORDER — SERTRALINE HCL 50 MG PO TABS: 150.0000 mg | ORAL_TABLET | Freq: Every day | ORAL | 0 refills | Status: DC

## 2020-05-15 NOTE — Telephone Encounter (Signed)
Pt needs refill on Sertraline and will need it expedited. [facility]  I will call him once sent

## 2020-05-15 NOTE — Telephone Encounter (Signed)
sent 

## 2020-05-15 NOTE — Progress Notes (Signed)
Virtual Visit via Video Note  I connected with Keith Parks on 05/15/20 at  4:00 PM EDT by a video enabled telemedicine application and verified that I am speaking with the correct person using two identifiers.   I discussed the limitations of evaluation and management by telemedicine and the availability of in person appointments. The patient expressed understanding and agreed to proceed.  LOCATION: Patient: Home Provider: Home  History of Present Illness: [facility], depression and anxiety.    Observations/Objective:Patient presents depressed for his counseling webex session. Patient described his psychiatric symptoms and current life events. Signed tx plan from last visit. Patient provided updates: panic attacks, depressive symptoms, suicidal thoughts, anxiety, sleep issues, work, family, girlfriend. Patient reports he recently saw Dr. Fredda Hammed. Patient does not feel his medications are working. Suggested patient speak to his dr (PCP) at the [facility] about genetic testing. Cln provided psychoeducation on the benefits of gentic testing.  Patient reports "I'm overthinking things in my relationship." Cln provided psychoeducation on overthinking, challenging negative thoughts and role played. Will continue to see patient 1x per month.  Assessment and Plan: Counselor will continue to meet with patient to address treatment plan goals. Patient will continue to follow recommendations of providers and implement skills learned in session.    Follow Up Instructions: I discussed the assessment and treatment plan with the patient. The patient was provided an opportunity to ask questions and all were answered. The patient agreed with the plan and demonstrated an understanding of the instructions.   The patient was advised to call back or seek an in-person evaluation if the symptoms worsen or if the condition fails to improve as  anticipated.  I provided 45 minutes of non-face-to-face time during this encounter.   Keaston Pile S, LCAS

## 2020-06-14 ENCOUNTER — Other Ambulatory Visit: Payer: Self-pay

## 2020-06-14 ENCOUNTER — Ambulatory Visit (INDEPENDENT_AMBULATORY_CARE_PROVIDER_SITE_OTHER): Payer: No Typology Code available for payment source | Admitting: Licensed Clinical Social Worker

## 2020-06-14 ENCOUNTER — Encounter (HOSPITAL_COMMUNITY): Payer: Self-pay | Admitting: Licensed Clinical Social Worker

## 2020-06-14 DIAGNOSIS — F411 Generalized anxiety disorder: Secondary | ICD-10-CM

## 2020-06-14 DIAGNOSIS — F410 Panic disorder [episodic paroxysmal anxiety] without agoraphobia: Secondary | ICD-10-CM

## 2020-06-14 DIAGNOSIS — F331 Major depressive disorder, recurrent, moderate: Secondary | ICD-10-CM | POA: Diagnosis not present

## 2020-06-14 NOTE — Progress Notes (Signed)
Virtual Visit via Video Note  I connected with Guilherme A Defrancesco on 06/14/20 at  4:00 PM EDT by a video enabled telemedicine application and verified that I am speaking with the correct person using two identifiers.   I discussed the limitations of evaluation and management by telemedicine and the availability of in person appointments. The patient expressed understanding and agreed to proceed.  LOCATION: Patient: Home Provider: Home  History of Present Illness: [facility], depression and anxiety.    Observations/Objective:Patient presents depressed for his virtual counseling session. Patient described his psychiatric symptoms and current life events. Patient provided updates: panic attacks, depressive symptoms, suicidal thoughts, anxiety, sleep issues, family, girlfriend. Patient does not feel his medications are working. Suggested pt contacted his psychiatrist Dr Fredda Hammed about an earlier appointment to discuss his medications. Patient spoke to his dr (PCP) at the [facility] about genetic testing and pcp will contact [facility] to discuss the genetic testing. Again, patient reports "I'm overthinking things in my relationship." Cln provided psychoeducation on overthinking, challenging negative thoughts.  Will continue to see patient 1x per month.  Assessment and Plan: Counselor will continue to meet with patient to address treatment plan goals. Patient will continue to follow recommendations of providers and implement skills learned in session.    Follow Up Instructions: I discussed the assessment and treatment plan with the patient. The patient was provided an opportunity to ask questions and all were answered. The patient agreed with the plan and demonstrated an understanding of the instructions.   The patient was advised to call back or seek an in-person evaluation if the symptoms worsen or if the condition fails to improve as  anticipated.  I provided 45 minutes of non-face-to-face time during this encounter.   Jayline Kilburg S, LCAS

## 2020-06-19 ENCOUNTER — Telehealth (HOSPITAL_COMMUNITY): Payer: Self-pay | Admitting: Psychiatry

## 2020-06-19 MED ORDER — SERTRALINE HCL 50 MG PO TABS: 150.0000 mg | ORAL_TABLET | Freq: Every day | ORAL | 0 refills | Status: DC

## 2020-06-19 NOTE — Telephone Encounter (Signed)
Pt needs refill on zoloft [facility]

## 2020-06-19 NOTE — Telephone Encounter (Signed)
He needs to make appointment before next refill. I have sent this time. Keep it noted

## 2020-07-03 ENCOUNTER — Telehealth (INDEPENDENT_AMBULATORY_CARE_PROVIDER_SITE_OTHER): Payer: No Typology Code available for payment source | Admitting: Psychiatry

## 2020-07-03 ENCOUNTER — Encounter (HOSPITAL_COMMUNITY): Payer: Self-pay | Admitting: Psychiatry

## 2020-07-03 DIAGNOSIS — F331 Major depressive disorder, recurrent, moderate: Secondary | ICD-10-CM

## 2020-07-03 DIAGNOSIS — F411 Generalized anxiety disorder: Secondary | ICD-10-CM | POA: Diagnosis not present

## 2020-07-03 DIAGNOSIS — F410 Panic disorder [episodic paroxysmal anxiety] without agoraphobia: Secondary | ICD-10-CM

## 2020-07-03 MED ORDER — BUSPIRONE HCL 10 MG PO TABS: 10.0000 mg | ORAL_TABLET | Freq: Three times a day (TID) | ORAL | 1 refills | Status: DC | PRN
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
## 2020-07-17 ENCOUNTER — Other Ambulatory Visit: Payer: Self-pay

## 2020-07-17 ENCOUNTER — Ambulatory Visit (HOSPITAL_COMMUNITY): Payer: No Typology Code available for payment source | Admitting: Licensed Clinical Social Worker

## 2020-07-26 ENCOUNTER — Telehealth (HOSPITAL_COMMUNITY): Payer: Self-pay | Admitting: Psychiatry

## 2020-07-26 NOTE — Telephone Encounter (Signed)
Pt needs refill on sertraline and trazadone [facility]

## 2020-07-27 MED ORDER — TRAZODONE HCL 150 MG PO TABS
150.0000 mg | ORAL_TABLET | Freq: Every day | ORAL | 0 refills | Status: DC
Start: 2020-07-27 — End: 2020-09-04

## 2020-07-27 MED ORDER — SERTRALINE HCL 50 MG PO TABS
150.0000 mg | ORAL_TABLET | Freq: Every day | ORAL | 0 refills | Status: DC
Start: 2020-07-27 — End: 2020-07-27

## 2020-07-27 MED ORDER — SERTRALINE HCL 50 MG PO TABS
150.0000 mg | ORAL_TABLET | Freq: Every day | ORAL | 0 refills | Status: DC
Start: 2020-07-27 — End: 2020-09-04

## 2020-07-27 MED ORDER — TRAZODONE HCL 150 MG PO TABS
150.0000 mg | ORAL_TABLET | Freq: Every day | ORAL | 0 refills | Status: DC
Start: 2020-07-27 — End: 2020-07-27

## 2020-07-27 NOTE — Telephone Encounter (Signed)
Sent and please call [facility] and cancel refills sent there as well thanks

## 2020-09-04 ENCOUNTER — Telehealth (INDEPENDENT_AMBULATORY_CARE_PROVIDER_SITE_OTHER): Payer: No Typology Code available for payment source | Admitting: Psychiatry

## 2020-09-04 ENCOUNTER — Encounter (HOSPITAL_COMMUNITY): Payer: Self-pay | Admitting: Psychiatry

## 2020-09-04 DIAGNOSIS — F5102 Adjustment insomnia: Secondary | ICD-10-CM | POA: Diagnosis not present

## 2020-09-04 DIAGNOSIS — F411 Generalized anxiety disorder: Secondary | ICD-10-CM

## 2020-09-04 DIAGNOSIS — F331 Major depressive disorder, recurrent, moderate: Secondary | ICD-10-CM | POA: Diagnosis not present

## 2020-09-04 MED ORDER — TRAZODONE HCL 150 MG PO TABS
150.0000 mg | ORAL_TABLET | Freq: Every day | ORAL | 0 refills | Status: DC
Start: 2020-09-04 — End: 2020-10-25

## 2020-09-04 MED ORDER — SERTRALINE HCL 50 MG PO TABS
150.0000 mg | ORAL_TABLET | Freq: Every day | ORAL | 0 refills | Status: DC
Start: 2020-09-04 — End: 2020-10-25

## 2020-09-04 MED ORDER — HYDROXYZINE PAMOATE 25 MG PO CAPS: 25.0000 mg | ORAL_CAPSULE | Freq: Every evening | ORAL | 1 refills | Status: DC | PRN
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
## 2020-10-25 ENCOUNTER — Telehealth (HOSPITAL_COMMUNITY): Payer: Self-pay | Admitting: Psychiatry

## 2020-10-25 MED ORDER — BUSPIRONE HCL 10 MG PO TABS
10.0000 mg | ORAL_TABLET | Freq: Three times a day (TID) | ORAL | 1 refills | Status: DC | PRN
Start: 2020-10-25 — End: 2021-02-09

## 2020-10-25 MED ORDER — HYDROXYZINE PAMOATE 25 MG PO CAPS: 25.0000 mg | ORAL_CAPSULE | Freq: Every evening | ORAL | 1 refills | Status: DC | PRN

## 2020-10-25 MED ORDER — SERTRALINE HCL 50 MG PO TABS
150.0000 mg | ORAL_TABLET | Freq: Every day | ORAL | 0 refills | Status: DC
Start: 2020-10-25 — End: 2020-12-07

## 2020-10-25 MED ORDER — TRAZODONE HCL 150 MG PO TABS
150.0000 mg | ORAL_TABLET | Freq: Every day | ORAL | 0 refills | Status: DC
Start: 2020-10-25 — End: 2021-05-14

## 2020-10-25 NOTE — Telephone Encounter (Signed)
Sent all rx's to pharmacy

## 2020-10-25 NOTE — Telephone Encounter (Signed)
Pt needs refill hydroxyzine, buspar, zoloft, trazadone,  [employer]

## 2020-11-03 ENCOUNTER — Telehealth (HOSPITAL_COMMUNITY): Payer: No Typology Code available for payment source | Admitting: Psychiatry

## 2020-12-07 ENCOUNTER — Other Ambulatory Visit (HOSPITAL_COMMUNITY): Payer: Self-pay

## 2020-12-07 MED ORDER — SERTRALINE HCL 50 MG PO TABS: 150.0000 mg | ORAL_TABLET | Freq: Every day | ORAL | 0 refills | Status: DC

## 2020-12-13 ENCOUNTER — Encounter (HOSPITAL_COMMUNITY): Payer: Self-pay | Admitting: Psychiatry

## 2020-12-13 ENCOUNTER — Telehealth (INDEPENDENT_AMBULATORY_CARE_PROVIDER_SITE_OTHER): Payer: Self-pay | Admitting: Psychiatry

## 2020-12-13 DIAGNOSIS — F331 Major depressive disorder, recurrent, moderate: Secondary | ICD-10-CM

## 2020-12-13 DIAGNOSIS — F411 Generalized anxiety disorder: Secondary | ICD-10-CM

## 2020-12-13 DIAGNOSIS — Z7289 Other problems related to lifestyle: Secondary | ICD-10-CM

## 2020-12-13 DIAGNOSIS — Z789 Other specified health status: Secondary | ICD-10-CM

## 2020-12-13 MED ORDER — ARIPIPRAZOLE 5 MG PO TABS: 5.0000 mg | ORAL_TABLET | Freq: Every day | ORAL | 1 refills | Status: DC
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
## 2021-02-09 ENCOUNTER — Encounter (HOSPITAL_COMMUNITY): Payer: Self-pay | Admitting: Psychiatry

## 2021-02-09 ENCOUNTER — Telehealth (INDEPENDENT_AMBULATORY_CARE_PROVIDER_SITE_OTHER): Payer: No Typology Code available for payment source | Admitting: Psychiatry

## 2021-02-09 DIAGNOSIS — Z7289 Other problems related to lifestyle: Secondary | ICD-10-CM | POA: Diagnosis not present

## 2021-02-09 DIAGNOSIS — F411 Generalized anxiety disorder: Secondary | ICD-10-CM

## 2021-02-09 DIAGNOSIS — F331 Major depressive disorder, recurrent, moderate: Secondary | ICD-10-CM | POA: Diagnosis not present

## 2021-02-09 DIAGNOSIS — Z789 Other specified health status: Secondary | ICD-10-CM

## 2021-02-09 MED ORDER — SERTRALINE HCL 50 MG PO TABS: 150.0000 mg | ORAL_TABLET | Freq: Every day | ORAL | 2 refills | Status: DC

## 2021-02-09 MED ORDER — ARIPIPRAZOLE 5 MG PO TABS: 5.0000 mg | ORAL_TABLET | Freq: Every day | ORAL | 2 refills | Status: DC

## 2021-02-09 MED ORDER — BUSPIRONE HCL 10 MG PO TABS: 10.0000 mg | ORAL_TABLET | Freq: Three times a day (TID) | ORAL | 2 refills | Status: DC | PRN
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
## 2021-02-19 ENCOUNTER — Other Ambulatory Visit (HOSPITAL_COMMUNITY): Payer: Self-pay

## 2021-02-19 MED ORDER — BUSPIRONE HCL 10 MG PO TABS: 10.0000 mg | ORAL_TABLET | Freq: Three times a day (TID) | ORAL | 0 refills | Status: DC | PRN

## 2021-02-19 MED ORDER — ARIPIPRAZOLE 5 MG PO TABS: 5.0000 mg | ORAL_TABLET | Freq: Every day | ORAL | 0 refills | Status: DC

## 2021-02-19 MED ORDER — SERTRALINE HCL 50 MG PO TABS: 150.0000 mg | ORAL_TABLET | Freq: Every day | ORAL | 0 refills | Status: DC

## 2021-05-04 ENCOUNTER — Telehealth (HOSPITAL_COMMUNITY): Payer: No Typology Code available for payment source | Admitting: Psychiatry

## 2021-05-14 ENCOUNTER — Encounter (HOSPITAL_COMMUNITY): Payer: Self-pay | Admitting: Psychiatry

## 2021-05-14 ENCOUNTER — Telehealth (INDEPENDENT_AMBULATORY_CARE_PROVIDER_SITE_OTHER): Payer: No Typology Code available for payment source | Admitting: Psychiatry

## 2021-05-14 DIAGNOSIS — Z7289 Other problems related to lifestyle: Secondary | ICD-10-CM

## 2021-05-14 DIAGNOSIS — F331 Major depressive disorder, recurrent, moderate: Secondary | ICD-10-CM

## 2021-05-14 DIAGNOSIS — F410 Panic disorder [episodic paroxysmal anxiety] without agoraphobia: Secondary | ICD-10-CM

## 2021-05-14 DIAGNOSIS — F411 Generalized anxiety disorder: Secondary | ICD-10-CM

## 2021-05-14 DIAGNOSIS — Z789 Other specified health status: Secondary | ICD-10-CM

## 2021-05-14 MED ORDER — SERTRALINE HCL 100 MG PO TABS: 100.0000 mg | ORAL_TABLET | Freq: Two times a day (BID) | ORAL | 1 refills | Status: DC

## 2021-05-14 MED ORDER — ARIPIPRAZOLE 5 MG PO TABS: 5.0000 mg | ORAL_TABLET | Freq: Every day | ORAL | 0 refills | Status: DC
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
## 2021-07-09 ENCOUNTER — Telehealth (INDEPENDENT_AMBULATORY_CARE_PROVIDER_SITE_OTHER): Payer: No Typology Code available for payment source | Admitting: Psychiatry

## 2021-07-09 ENCOUNTER — Encounter (HOSPITAL_COMMUNITY): Payer: Self-pay | Admitting: Psychiatry

## 2021-07-09 DIAGNOSIS — Z7289 Other problems related to lifestyle: Secondary | ICD-10-CM | POA: Diagnosis not present

## 2021-07-09 DIAGNOSIS — F331 Major depressive disorder, recurrent, moderate: Secondary | ICD-10-CM | POA: Diagnosis not present

## 2021-07-09 DIAGNOSIS — F410 Panic disorder [episodic paroxysmal anxiety] without agoraphobia: Secondary | ICD-10-CM | POA: Diagnosis not present

## 2021-07-09 DIAGNOSIS — Z789 Other specified health status: Secondary | ICD-10-CM

## 2021-07-09 MED ORDER — ARIPIPRAZOLE 5 MG PO TABS: 5.0000 mg | ORAL_TABLET | Freq: Every day | ORAL | 0 refills | Status: DC

## 2021-07-09 MED ORDER — HYDROXYZINE PAMOATE 25 MG PO CAPS: 25.0000 mg | ORAL_CAPSULE | Freq: Every day | ORAL | 0 refills | Status: DC | PRN
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
## 2022-06-10 ENCOUNTER — Emergency Department (HOSPITAL_COMMUNITY)
Admission: EM | Admit: 2022-06-10 | Discharge: 2022-06-10 | Discharge status: Against Medical Advice | Payer: Non-veteran care | Attending: Emergency Medicine | Admitting: Emergency Medicine

## 2022-06-10 ENCOUNTER — Encounter (HOSPITAL_COMMUNITY): Payer: Self-pay | Admitting: Emergency Medicine

## 2022-06-10 DIAGNOSIS — R1110 Vomiting, unspecified: Secondary | ICD-10-CM | POA: Diagnosis present

## 2022-06-10 DIAGNOSIS — Z5321 Procedure and treatment not carried out due to patient leaving prior to being seen by health care provider: Secondary | ICD-10-CM | POA: Diagnosis not present

## 2022-06-10 HISTORY — DX: Major depressive disorder, single episode, unspecified: F32.9

## 2022-06-10 HISTORY — DX: Alcohol abuse, uncomplicated: F10.10

## 2022-06-10 LAB — COMPREHENSIVE METABOLIC PANEL
ALT: 34 U/L (ref 0–44)
AST: 27 U/L (ref 15–41)
Albumin: 5.4 g/dL — ABNORMAL HIGH (ref 3.5–5.0)
Alkaline Phosphatase: 85 U/L (ref 38–126)
Anion gap: 15 (ref 5–15)
BUN: 11 mg/dL (ref 6–20)
CO2: 22 mmol/L (ref 22–32)
Calcium: 10.1 mg/dL (ref 8.9–10.3)
Chloride: 102 mmol/L (ref 98–111)
Creatinine, Ser: 0.86 mg/dL (ref 0.61–1.24)
GFR, Estimated: >60 mL/min (ref >60)
Glucose, Bld: 111 mg/dL — ABNORMAL HIGH (ref 70–99)
Potassium: 3.4 mmol/L — ABNORMAL LOW (ref 3.5–5.1)
Sodium: 139 mmol/L (ref 135–145)
Total Bilirubin: 1.6 mg/dL — ABNORMAL HIGH (ref 0.3–1.2)
Total Protein: 8.5 g/dL — ABNORMAL HIGH (ref 6.5–8.1)

## 2022-06-10 LAB — CBC
HCT: 48.8 % (ref 39.0–52.0)
Hemoglobin: 17.1 g/dL — ABNORMAL HIGH (ref 13.0–17.0)
MCH: 30.8 pg (ref 26.0–34.0)
MCHC: 35 g/dL (ref 30.0–36.0)
MCV: 87.9 fL (ref 80.0–100.0)
Platelets: 317 10*3/uL (ref 150–400)
RBC: 5.55 MIL/uL (ref 4.22–5.81)
RDW: 13.4 % (ref 11.5–15.5)
WBC: 10.2 10*3/uL (ref 4.0–10.5)
nRBC: 0 % (ref 0.0–0.2)

## 2022-06-10 LAB — LIPASE, BLOOD: Lipase: 23 U/L (ref 11–51)

## 2022-06-10 NOTE — ED Triage Notes (Signed)
Pt arrives POV c/o emesis since Friday. Pt states he thinks it may have some blood in the emesis. Pt states hx with stomach problems but never told exactly what was wrong.

## 2023-03-03 ENCOUNTER — Other Ambulatory Visit: Payer: Self-pay

## 2023-03-03 ENCOUNTER — Ambulatory Visit (INDEPENDENT_AMBULATORY_CARE_PROVIDER_SITE_OTHER): Payer: No Typology Code available for payment source | Admitting: Internal Medicine

## 2023-03-03 ENCOUNTER — Encounter: Payer: Self-pay | Admitting: Internal Medicine

## 2023-03-03 VITALS — BP 118/90 | HR 79 | Temp 98.8°F | Resp 20 | Ht 72.0 in | Wt 221.5 lb

## 2023-03-03 DIAGNOSIS — L299 Pruritus, unspecified: Secondary | ICD-10-CM | POA: Diagnosis not present

## 2023-03-03 DIAGNOSIS — J3089 Other allergic rhinitis: Secondary | ICD-10-CM

## 2023-03-03 DIAGNOSIS — R21 Rash and other nonspecific skin eruption: Secondary | ICD-10-CM | POA: Diagnosis not present

## 2023-03-03 MED ORDER — FLUTICASONE PROPIONATE 50 MCG/ACT NA SUSP: 2.0000 | Freq: Every day | NASAL | 5 refills | Status: DC

## 2023-03-03 MED ORDER — AZELASTINE HCL 0.1 % NA SOLN: 1.0000 | Freq: Two times a day (BID) | NASAL | 5 refills | Status: DC | PRN

## 2023-03-03 NOTE — Patient Instructions (Addendum)
Nonspecific Rash - Continue follow up with PCP. - If rash occurs again, please take pictures and schedule a follow up. - Do a daily soaking tub bath in warm water for 10-15 minutes.  - Use a gentle, unscented cleanser at the end of the bath (such as Dove unscented bar or baby wash, or Aveeno sensitive body wash). Then rinse, pat half-way dry, and apply a gentle, unscented moisturizer cream or ointment (Cerave, Cetaphil, Eucerin, Aveeno)  all over while still damp. The skin should be moisturized with a gentle, unscented moisturizer at least twice daily.  - Use only unscented liquid laundry detergent.  Chronic Rhinitis  - Positive skin test 02/2023:  none - Avoidance measures discussed. - Use nasal saline rinses before nose sprays such as with Neilmed Sinus Rinse.  Use distilled water.   - Use Flonase 2 sprays each nostril daily. Aim upward and outward. - Use Azelastine 1-2 sprays each nostril twice daily as needed for runny nose, drainage, sneezing, congestion. Aim upward and outward.

## 2023-03-03 NOTE — Progress Notes (Signed)
NEW PATIENT  Date of Service/Encounter:  03/03/23  Consult requested by: [facility]   Subjective:   Keith Parks (DOB: [birth year]) is a 27 y.o. male who presents to the clinic on 03/03/2023 with a chief complaint of Rash (Had a rash but cleared with meds ) .    History obtained from: chart review and patient.   Rash: Reports onset about 1 month ago and lasted for several weeks.  No pictures.  Reports it was itchy and looked similar to chigger bites. They gave him some cream at urgent care and it cleared up.  Denies any hives or eczema.  No issues with it again.   Rhinitis:  Started about 1 year ago. Symptoms include: nasal congestion, rhinorrhea, post nasal drainage, and sneezing  Occurs seasonally-Spring Potential triggers: not sure Treatments tried:  PRN anti histamine; last use was a long time ago  Previous allergy testing: no History of sinus surgery: no Nonallergic triggers: none   Past Medical History: Past Medical History:  Diagnosis Date   Anxiety    Depression    ETOH abuse    Hypertension    MDD (major depressive disorder)    Past Surgical History: History reviewed. No pertinent surgical history.  Family History: Family History  Problem Relation Age of Onset   Eczema Sister     Social History:  Lives in a 10 year house Flooring in bedroom: wood Pets: dogs and ferrets Tobacco use/exposure: vapes since 2014 Job: none  Medication List:  Allergies as of 03/03/2023   No Known Allergies      Medication List        Accurate as of March 03, 2023  4:10 PM. If you have any questions, ask your nurse or doctor.          acetaminophen 325 MG tablet Commonly known as: TYLENOL Take 2 tablets (650 mg total) by mouth every 4 (four) hours as needed for headache or mild pain.   ARIPiprazole 5 MG tablet Commonly known as: Abilify Take 1 tablet (5 mg total) by mouth daily.   colchicine 0.6 MG tablet Take 1 tablet (0.6 mg total)  by mouth 2 (two) times daily.   hydrOXYzine 25 MG capsule Commonly known as: VISTARIL Take 1 capsule (25 mg total) by mouth daily as needed for itching.   metoprolol tartrate 25 MG tablet Commonly known as: LOPRESSOR Take 0.5 tablets (12.5 mg total) by mouth 2 (two) times daily.   MIRTAZAPINE PO Take by mouth.   OMEPRAZOLE PO Take 40 mg by mouth daily.   sertraline 100 MG tablet Commonly known as: ZOLOFT Take 1 tablet (100 mg total) by mouth in the morning and at bedtime. Please cancel refills sent to [facility]         REVIEW OF SYSTEMS: Pertinent positives and negatives discussed in HPI.   Objective:   Physical Exam: BP (!) 118/90   Pulse 79   Temp 98.8 F (37.1 C)   Resp 20   Ht 6' (1.829 m)   Wt 221 lb 8 oz (100.5 kg)   SpO2 97%   BMI 30.04 kg/m  Body mass index is 30.04 kg/m. GEN: alert, well developed HEENT: clear conjunctiva, TM grey and translucent, nose with + inferior turbinate hypertrophy, pink nasal mucosa, slight clear rhinorrhea, no cobblestoning HEART: regular rate and rhythm, no murmur LUNGS: clear to auscultation bilaterally, no coughing, unlabored respiration ABDOMEN: soft, non distended  SKIN: no rashes or lesions  Reviewed:  06/11/2022: seen  in ER for epigastric pain, n/v, dehydration. CXR negative, WBC normal. Discussed pepcid.   Multiple visits in 2021 and 2022 for major depressive disorder, generalized anxiety and panic attacks.  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    Skin Testing:  Skin prick testing was placed, which includes aeroallergens/foods, histamine control, and saline control.  Verbal consent was obtained prior to placing test.  Patient tolerated procedure well.  Allergy testing results were read and interpreted by myself, documented by clinical staff. Adequate positive and negative control.  Results discussed with patient/family.  Airborne Adult Perc - 03/03/23 1458      Time Antigen Placed 1430    Allergen Manufacturer Waynette Buttery    Location Back    Number of Test 59    1. Control-Buffer 50% Glycerol Negative    2. Control-Histamine 1 mg/ml 2+    3. Albumin saline Negative    4. Bahia Negative    5. French Southern Territories Negative    6. Johnson Negative    7. Kentucky Blue Negative    8. Meadow Fescue Negative    9. Perennial Rye Negative    10. Sweet Vernal Negative    11. Timothy Negative    12. Cocklebur Negative    13. Burweed Marshelder Negative    14. Ragweed, short Negative    15. Ragweed, Giant Negative    16. Plantain,  English Negative    17. Lamb's Quarters Negative    18. Sheep Sorrell Negative    19. Rough Pigweed Negative    20. Marsh Elder, Rough Negative    21. Mugwort, Common Negative    22. Ash mix Negative    23. Birch mix Negative    24. Beech American Negative    25. Box, Elder Negative    26. Cedar, red Negative    27. Cottonwood, Guinea-Bissau Negative    28. Elm mix Negative    29. Hickory Negative    30. Maple mix Negative    31. Oak, Guinea-Bissau mix Negative    32. Pecan Pollen Negative    33. Pine mix Negative    34. Sycamore Eastern Negative    35. Walnut, Black Pollen Negative    36. Alternaria alternata Negative    37. Cladosporium Herbarum Negative    38. Aspergillus mix Negative    39. Penicillium mix Negative    40. Bipolaris sorokiniana (Helminthosporium) Negative    41. Drechslera spicifera (Curvularia) Negative    42. Mucor plumbeus Negative    43. Fusarium moniliforme Negative    44. Aureobasidium pullulans (pullulara) Negative    45. Rhizopus oryzae Negative    46. Botrytis cinera Negative    47. Epicoccum nigrum Negative    48. Phoma betae Negative    49. Candida Albicans Negative    50. Trichophyton mentagrophytes Negative    51. Mite, D Farinae  5,000 AU/ml Negative    52. Mite, D Pteronyssinus  5,000 AU/ml Negative    53. Cat Hair 10,000 BAU/ml Negative    54.  Dog Epithelia Negative    55. Mixed Feathers  Negative    56. Horse Epithelia Negative    57. Cockroach, German Negative    58. Mouse Negative    59. Tobacco Leaf Negative               Assessment:   1. Rash and nonspecific skin eruption   2. Pruritus   3. Other allergic rhinitis     Plan/Recommendations:  Nonspecific Rash/Pruritus - Unclear etiology, poor historian, no pictures.  - Continue follow up with PCP. - If rash occurs again, please take pictures and schedule a follow up. - Do a daily soaking tub bath in warm water for 10-15 minutes.  - Use a gentle, unscented cleanser at the end of the bath (such as Dove unscented bar or baby wash, or Aveeno sensitive body wash). Then rinse, pat half-way dry, and apply a gentle, unscented moisturizer cream or ointment (Cerave, Cetaphil, Eucerin, Aveeno)  all over while still damp. The skin should be moisturized with a gentle, unscented moisturizer at least twice daily.  - Use only unscented liquid laundry detergent.   Chronic Rhinitis - Due to turbinate hypertrophy and seasonal symptoms, performed skin testing to identify aeroallergen triggers.   - Positive skin test 02/2023:  none - Avoidance measures discussed. - Use nasal saline rinses before nose sprays such as with Neilmed Sinus Rinse.  Use distilled water.   - Use Flonase 2 sprays each nostril daily. Aim upward and outward. - Use Azelastine 1-2 sprays each nostril twice daily as needed. Aim upward and outward.     Return if symptoms worsen or fail to improve.  Alesia Morin, MD [facility]

## 2024-03-16 ENCOUNTER — Encounter: Payer: Self-pay | Admitting: Gastroenterology

## 2024-03-16 ENCOUNTER — Ambulatory Visit (INDEPENDENT_AMBULATORY_CARE_PROVIDER_SITE_OTHER): Admitting: Gastroenterology

## 2024-03-16 VITALS — BP 125/72 | HR 74 | Temp 98.5°F | Ht 72.0 in | Wt 182.6 lb

## 2024-03-16 DIAGNOSIS — K219 Gastro-esophageal reflux disease without esophagitis: Secondary | ICD-10-CM | POA: Diagnosis not present

## 2024-03-16 DIAGNOSIS — R142 Eructation: Secondary | ICD-10-CM

## 2024-03-16 DIAGNOSIS — K5900 Constipation, unspecified: Secondary | ICD-10-CM | POA: Diagnosis not present

## 2024-03-16 DIAGNOSIS — R1011 Right upper quadrant pain: Secondary | ICD-10-CM

## 2024-03-16 DIAGNOSIS — R17 Unspecified jaundice: Secondary | ICD-10-CM | POA: Diagnosis not present

## 2024-03-16 DIAGNOSIS — R140 Abdominal distension (gaseous): Secondary | ICD-10-CM | POA: Insufficient documentation

## 2024-03-16 MED ORDER — OMEPRAZOLE 40 MG PO CPDR: 40.0000 mg | DELAYED_RELEASE_CAPSULE | Freq: Every day | ORAL | 3 refills | Status: AC

## 2024-03-16 NOTE — Patient Instructions (Signed)
 Please have blood work done at [facility].  I recommend taking omeprazole once each day, 30 minutes before eating.   Avoid eating 2-3 hours before laying down.  I recommend taking Benefiber daily and drink plenty of water.   I will see you in 6-8 weeks.   It was a pleasure to see you today. I want to create trusting relationships with patients and provide genuine, compassionate, and quality care. I truly value your feedback, so please be on the lookout for a survey regarding your visit with me today. I appreciate your time in completing this!         Delman Ferns, PhD, ANP-BC [facility]

## 2024-03-16 NOTE — Progress Notes (Signed)
 Gastroenterology Office Note    Referring Provider: [facility] Primary Care Physician:  [facility]  Primary GI: Dr. Mordechai April    Chief Complaint   Chief Complaint  Patient presents with   New Patient (Initial Visit)    Pt referred for RUQ abd pain     History of Present Illness   Rodrickus A Blosser is a 28 y.o. male presenting today at the request of Center, [facility] due to RUQ abdominal pain.    He states he is not quite sure why he is here, but he does have some right-sided discomfort and belching since last spring/summer. Has to sit up in the mornings and then make himself belch. Tried to force a burp then would vomit. Present for about a year and worsening. Each morning notes this. For 2 hours has build up of burping. Feels like has to have a BM but doesn't. Will have small amount of output here and there. Has panic attacks. Anxiety with people. Feels like anxiety may worsen but doesn't make sense to him why it would be present when waking up. Feels like stool is there but doesn't want to come out. Avoiding straining. He was told he may be constipated. Unproductive stool at times. He has tried a laxative once.   No postprandial abdominal pain. Wakes up with gas in morning. If getting up and moving around will help some. No postprandial gas. Eats late at night. Laying down after eating. May drink a dr pepper once or twice a day. Mainly water.    EGD around 2021 due to hematemesis and GERD. States was negative. Has been taking omeprazole for 4-5 years. If doesn't take, has significant GERD and nausea. Not taking daily but instead prn and every 3 days. No dysphagia.   Drinks ETOH once a week. May drink a few at night occasionally. Used to drink heavily but no longer.   Gas pill OTC without help.    Jan 2025 labs at [facility] care with Tbili 1.9, AST 13, ALT 37, Alk Phos 82. Hgb 16.9. Drug screen positive for cannabinoids.   CT abd/pelvis with  contrast Jan 2025 at [facility]: negative liver and gallbladder, no CBD dilation. Mild retained gas and stool. Small volume retained fluid in duodenum.   No prior colonoscopy.   Unknown family history of colon cancer or colon polyps.   Past Medical History:  Diagnosis Date   Anxiety    Depression    ETOH abuse    Hypertension    MDD (major depressive disorder)     No past surgical history on file.  Current Outpatient Medications  Medication Sig Dispense Refill   acetaminophen  (TYLENOL ) 325 MG tablet Take 2 tablets (650 mg total) by mouth every 4 (four) hours as needed for headache or mild pain.     ARIPiprazole  (ABILIFY ) 5 MG tablet Take 1 tablet (5 mg total) by mouth daily. (Patient not taking: Reported on 03/03/2023) 90 tablet 0   azelastine  (ASTELIN ) 0.1 % nasal spray Place 1 spray into both nostrils 2 (two) times daily as needed for rhinitis. Use in each nostril as directed 30 mL 5   colchicine  0.6 MG tablet Take 1 tablet (0.6 mg total) by mouth 2 (two) times daily. (Patient not taking: Reported on 03/03/2023) 60 tablet 6   fluticasone  (FLONASE ) 50 MCG/ACT nasal spray Place 2 sprays into both nostrils daily. 16 g 5   hydrOXYzine  (VISTARIL ) 25 MG capsule Take 1 capsule (  25 mg total) by mouth daily as needed for itching. (Patient not taking: Reported on 03/03/2023) 30 capsule 0   metoprolol  tartrate (LOPRESSOR ) 25 MG tablet Take 0.5 tablets (12.5 mg total) by mouth 2 (two) times daily. (Patient not taking: Reported on 03/03/2023) 30 tablet 6   MIRTAZAPINE PO Take by mouth.     OMEPRAZOLE PO Take 40 mg by mouth daily.     sertraline  (ZOLOFT ) 100 MG tablet Take 1 tablet (100 mg total) by mouth in the morning and at bedtime. Please cancel refills sent to [facility] (Patient not taking: Reported on 03/03/2023) 60 tablet 1   No current facility-administered medications for this visit.    Allergies as of 03/16/2024   (No Known Allergies)    Family History  Problem Relation Age of Onset   Eczema  Sister     Social History   Socioeconomic History   Marital status: Legally Separated    Spouse name: Not on file   Number of children: Not on file   Years of education: Not on file   Highest education level: Not on file  Occupational History   Not on file  Tobacco Use   Smoking status: Never   Smokeless tobacco: Never  Vaping Use   Vaping status: Every Day   Substances: Nicotine   Substance and Sexual Activity   Alcohol use: Yes    Comment: couple times per week   Drug use: Yes    Types: Marijuana    Comment: none at the moment 03/03/2023   Sexual activity: Yes    Birth control/protection: Condom  Other Topics Concern   Not on file  Social History Narrative   Not on file   Social Drivers of Health   Financial Resource Strain: Not on file  Food Insecurity: Not on file  Transportation Needs: Not on file  Physical Activity: Not on file  Stress: Not on file  Social Connections: Not on file  Intimate Partner Violence: Not on file     Review of Systems   Gen: Denies any fever, chills, fatigue, weight loss, lack of appetite.  CV: Denies chest pain, heart palpitations, peripheral edema, syncope.  Resp: Denies shortness of breath at rest or with exertion. Denies wheezing or cough.  GI: Denies dysphagia or odynophagia. Denies jaundice, hematemesis, fecal incontinence. GU : Denies urinary burning, urinary frequency, urinary hesitancy MS: Denies joint pain, muscle weakness, cramps, or limitation of movement.  Derm: Denies rash, itching, dry skin Psych: Denies depression, anxiety, memory loss, and confusion Heme: Denies bruising, bleeding, and enlarged lymph nodes.   Physical Exam   There were no vitals taken for this visit. General:   Alert and oriented. Pleasant and cooperative. Well-nourished and well-developed.  Head:  Normocephalic and atraumatic. Eyes:  Without icterus Ears:  Normal auditory acuity. Lungs:  Clear to auscultation bilaterally.  Heart:  S1, S2  present without murmurs appreciated.  Abdomen:  +BS, soft, non-tender and non-distended. No HSM noted. No guarding or rebound. No masses appreciated.  Rectal:  Deferred  Msk:  Symmetrical without gross deformities. Normal posture. Extremities:  Without edema. Neurologic:  Alert and  oriented x4;  grossly normal neurologically. Skin:  Intact without significant lesions or rashes. Psych:  Alert and cooperative. Normal mood and affect.   Assessment   Paddy A Mole is a 28 y.o. male presenting today  at the request of [facility] due to RUQ discomfort and burping for the past year.   RUQ discomfort, burping: interesting constellation  of symptoms but without postprandial component. CT and labs reviewed. No gallstones on CT, although this is not sensitive for that. Does not appear biliary. I do note chronic hx of GERD and has been sporadically taking PPI, along with eating late at night. Majority of symptoms in morning. Will resume omeprazole 40 mg once daily and employ behavior modifications. Can consider EGD if no improvement. Last in 2021 at the [place] but records not available at time of visit. Check celiac serologies due to bloating as well.   Constipation: with unproductive stools in morning. Could be contributing to constellation of symptoms. No alarm signs. Start Benefiber daily. May need Linzess low dose.   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 Will check HFP.     PLAN   Omeprazole 40 mg daily  Benefiber daily  Behavior modifications  Celiac serologies and HFP  Return in 6-8 weeks  Delman Ferns, PhD, [facility]enter[facility]

## 2024-03-19 LAB — HEPATIC FUNCTION PANEL
ALT: 26 IU/L (ref 0–44)
AST: 17 IU/L (ref 0–40)
Albumin: 4.9 g/dL (ref 4.3–5.2)
Alkaline Phosphatase: 81 IU/L (ref 44–121)
Bilirubin Total: 0.6 mg/dL (ref 0.0–1.2)
Bilirubin, Direct: 0.22 mg/dL (ref 0.00–0.40)
Total Protein: 6.7 g/dL (ref 6.0–8.5)

## 2024-03-19 LAB — CELIAC DISEASE PANEL
Endomysial IgA: NEGATIVE
IgA/Immunoglobulin A, Serum: 187 mg/dL (ref 90–386)
Transglutaminase IgA: <2 U/mL (ref 0–3)

## 2024-03-24 ENCOUNTER — Encounter (HOSPITAL_COMMUNITY): Payer: Self-pay | Admitting: Family Medicine

## 2024-03-25 ENCOUNTER — Other Ambulatory Visit (HOSPITAL_COMMUNITY): Payer: Self-pay | Admitting: Family Medicine

## 2024-03-25 DIAGNOSIS — R946 Abnormal results of thyroid function studies: Secondary | ICD-10-CM

## 2024-04-06 ENCOUNTER — Ambulatory Visit (HOSPITAL_COMMUNITY)
Admission: RE | Admit: 2024-04-06 | Discharge: 2024-04-06 | Disposition: A | Discharge status: Home / Self Care | Source: Ambulatory Visit | Attending: Family Medicine | Admitting: Family Medicine

## 2024-04-06 DIAGNOSIS — R946 Abnormal results of thyroid function studies: Secondary | ICD-10-CM | POA: Insufficient documentation

## 2024-04-28 ENCOUNTER — Encounter: Payer: Self-pay | Admitting: *Deleted

## 2024-04-28 ENCOUNTER — Ambulatory Visit: Admitting: Gastroenterology

## 2024-04-28 ENCOUNTER — Encounter: Payer: Self-pay | Admitting: Gastroenterology

## 2024-04-28 VITALS — BP 113/68 | HR 68 | Temp 98.2°F | Ht 72.0 in | Wt 177.9 lb

## 2024-04-28 DIAGNOSIS — R142 Eructation: Secondary | ICD-10-CM

## 2024-04-28 DIAGNOSIS — K219 Gastro-esophageal reflux disease without esophagitis: Secondary | ICD-10-CM

## 2024-04-28 DIAGNOSIS — K920 Hematemesis: Secondary | ICD-10-CM

## 2024-04-28 DIAGNOSIS — R1013 Epigastric pain: Secondary | ICD-10-CM

## 2024-04-28 DIAGNOSIS — R1012 Left upper quadrant pain: Secondary | ICD-10-CM

## 2024-04-28 NOTE — H&P (View-Only) (Signed)
 Gastroenterology Office Note     Primary Care Physician:  [facility]  Primary Gastroenterologist: Dr. Mordechai April    Chief Complaint   Chief Complaint  Patient presents with   Follow-up    Pt arrives for follow up. Pt states fiber was tried for constipation-pt states he does not think he is having that issue. Pt tried benefiber for 2 weeks but no changes. Having left sided abdominal pain. Also having trapped gas that cause abdominal pain.      History of Present Illness   Keith Parks is a 28 y.o. male presenting today with a history of GERD, constipation, RUQ discomfort, last seen in April 2025 with recommendations to resume PPI daily with EGD if no improvement. Labs also ordered with normal LFTs, negative celiac. CT jan 2025 at [facility] unrevealing. Gallbladder present but has not seemed typical biliary.   At last visit we also recommended starting fiber, in case underlying constipation was playing a role. Tried fiber and didn't see results. Having gas build up. BM usually daily. Doesn't feel constipated. Pain on left side now.  Celiac serologies negative.   GERD: omeprazole  40 mg but not taking daily. Taking every 3 days. Not as bad as used to be. Noting LUQ discomfort, not RUQ discomfort. No pain after eating. Will eventually burp and feel better. Notices when waking. Lasts throughout the whole day. Waxing and waning. Sinus drainage.   Regurgitate if trying to get gas up. Will have coffee grounds if regurgitating. Ibuprofen for headache. 3-4 times last 7 days, but in the past was every few weeks. No melena.   Symptoms started about 2 summers ago. Stopped mirtazepine last year. Lost 50-60 lbs since stopping it.  Had gained weight.   Drinks once or twice a week. Will have 3 [employer] cans once or twice a week. Used to drink nightly. Cut back on drinking.   Last EGD in 2021 at the [place] but records not available.     Past Medical History:  Diagnosis Date    Anxiety    Depression    ETOH abuse    Hypertension    MDD (major depressive disorder)     Past Surgical History:  Procedure Laterality Date   WISDOM TOOTH EXTRACTION      Current Outpatient Medications  Medication Sig Dispense Refill   acetaminophen  (TYLENOL ) 325 MG tablet Take 2 tablets (650 mg total) by mouth every 4 (four) hours as needed for headache or mild pain.     busPIRone  (BUSPAR ) 5 MG tablet Take 5 mg by mouth 3 (three) times daily.     clonazePAM (KLONOPIN) 0.5 MG tablet Take 0.5 mg by mouth 2 (two) times daily as needed for anxiety.     omeprazole  (PRILOSEC) 40 MG capsule Take 1 capsule (40 mg total) by mouth daily. 30 minutes before eating 90 capsule 3   No current facility-administered medications for this visit.    Allergies as of 04/28/2024   (No Known Allergies)    Family History  Problem Relation Age of Onset   Eczema Sister    Colon polyps Neg Hx    Colon cancer Neg Hx     Social History   Socioeconomic History   Marital status: Legally Separated    Spouse name: Not on file   Number of children: Not on file   Years of education: Not on file   Highest education level: Not on file  Occupational History   Not on  file  Tobacco Use   Smoking status: Never   Smokeless tobacco: Never  Vaping Use   Vaping status: Every Day   Substances: Nicotine   Substance and Sexual Activity   Alcohol use: Yes    Comment: couple times per week   Drug use: Yes    Types: Marijuana    Comment: none at the moment 03/03/2023   Sexual activity: Yes    Birth control/protection: Condom  Other Topics Concern   Not on file  Social History Narrative   Not on file   Social Drivers of Health   Financial Resource Strain: Not on file  Food Insecurity: Not on file  Transportation Needs: Not on file  Physical Activity: Not on file  Stress: Not on file  Social Connections: Not on file  Intimate Partner Violence: Not on file     Review of Systems   Gen: Denies any  fever, chills, fatigue, weight loss, lack of appetite.  CV: Denies chest pain, heart palpitations, peripheral edema, syncope.  Resp: Denies shortness of breath at rest or with exertion. Denies wheezing or cough.  GI: Denies dysphagia or odynophagia. Denies jaundice, hematemesis, fecal incontinence. GU : Denies urinary burning, urinary frequency, urinary hesitancy MS: Denies joint pain, muscle weakness, cramps, or limitation of movement.  Derm: Denies rash, itching, dry skin Psych: Denies depression, anxiety, memory loss, and confusion Heme: Denies bruising, bleeding, and enlarged lymph nodes.   Physical Exam   BP 113/68   Pulse 68   Temp 98.2 F (36.8 C)   Ht 6' (1.829 m)   Wt 177 lb 14.4 oz (80.7 kg)   BMI 24.13 kg/m  General:   Alert and oriented. Pleasant and cooperative. Well-nourished and well-developed.  Head:  Normocephalic and atraumatic. Eyes:  Without icterus Abdomen:  +BS, soft, non-tender and non-distended. No HSM noted. No guarding or rebound. No masses appreciated.  Rectal:  Deferred  Msk:  Symmetrical without gross deformities. Normal posture. Extremities:  Without edema. Neurologic:  Alert and  oriented x4;  grossly normal neurologically. Skin:  Intact without significant lesions or rashes. Psych:  Alert and cooperative. Normal mood and affect.   Assessment   Keith Parks is a 28 y.o. male presenting today with a history of GERD, constipation now resolved, abdominal pain, presenting with persistent dyspepsia.  Dyspepsia: for several years and noting increased belching, regurgitating, and coffee grounds with regurgitating but no melena. Despite recommendations to take PPI daily has been taking every few days but notes GERD is somewhat better. Historically with RUQ discomfort but now LUQ discomfort.  NSAID exposure recently. Suspect atypical GERD, gastritis, possible NSAID-induced insult. As last EGD in 2021 and records not available, will arrange diagnostic  EGD.   PLAN    Take omeprazole  40 mg daily, 30 minutes before breakfast Proceed with upper endoscopy by Dr. Mordechai April in near future: the risks, benefits, and alternatives have been discussed with the patient in detail. The patient states understanding and desires to proceed.  Avoid NSAIDs 3 month return   Delman Ferns, PhD, [facility] Gastroenterology

## 2024-04-28 NOTE — Patient Instructions (Signed)
 Make sure to take omeprazole  every day, 30 minutes before breakfast.  We are arranging an upper endoscopy with Dr Mordechai April.   Limit or avoid Ibuprofen if at all possible.  We will see you in 3 months!  I enjoyed seeing you again today! I value our relationship and want to provide genuine, compassionate, and quality care. You may receive a survey regarding your visit with me, and I welcome your feedback! Thanks so much for taking the time to complete this. I look forward to seeing you again.      Delman Ferns, PhD, ANP-BC [facility] Gastroenterology

## 2024-04-28 NOTE — Progress Notes (Signed)
 Gastroenterology Office Note     Primary Care Physician:  [facility]  Primary Gastroenterologist: Dr. Mordechai April    Chief Complaint   Chief Complaint  Patient presents with   Follow-up    Pt arrives for follow up. Pt states fiber was tried for constipation-pt states he does not think he is having that issue. Pt tried benefiber for 2 weeks but no changes. Having left sided abdominal pain. Also having trapped gas that cause abdominal pain.      History of Present Illness   Keith Parks is a 28 y.o. male presenting today with a history of GERD, constipation, RUQ discomfort, last seen in April 2025 with recommendations to resume PPI daily with EGD if no improvement. Labs also ordered with normal LFTs, negative celiac. CT jan 2025 at [facility] unrevealing. Gallbladder present but has not seemed typical biliary.   At last visit we also recommended starting fiber, in case underlying constipation was playing a role. Tried fiber and didn't see results. Having gas build up. BM usually daily. Doesn't feel constipated. Pain on left side now.  Celiac serologies negative.   GERD: omeprazole  40 mg but not taking daily. Taking every 3 days. Not as bad as used to be. Noting LUQ discomfort, not RUQ discomfort. No pain after eating. Will eventually burp and feel better. Notices when waking. Lasts throughout the whole day. Waxing and waning. Sinus drainage.   Regurgitate if trying to get gas up. Will have coffee grounds if regurgitating. Ibuprofen for headache. 3-4 times last 7 days, but in the past was every few weeks. No melena.   Symptoms started about 2 summers ago. Stopped mirtazepine last year. Lost 50-60 lbs since stopping it.  Had gained weight.   Drinks once or twice a week. Will have 3 [employer] cans once or twice a week. Used to drink nightly. Cut back on drinking.   Last EGD in 2021 at the [place] but records not available.     Past Medical History:  Diagnosis Date    Anxiety    Depression    ETOH abuse    Hypertension    MDD (major depressive disorder)     Past Surgical History:  Procedure Laterality Date   WISDOM TOOTH EXTRACTION      Current Outpatient Medications  Medication Sig Dispense Refill   acetaminophen  (TYLENOL ) 325 MG tablet Take 2 tablets (650 mg total) by mouth every 4 (four) hours as needed for headache or mild pain.     busPIRone  (BUSPAR ) 5 MG tablet Take 5 mg by mouth 3 (three) times daily.     clonazePAM (KLONOPIN) 0.5 MG tablet Take 0.5 mg by mouth 2 (two) times daily as needed for anxiety.     omeprazole  (PRILOSEC) 40 MG capsule Take 1 capsule (40 mg total) by mouth daily. 30 minutes before eating 90 capsule 3   No current facility-administered medications for this visit.    Allergies as of 04/28/2024   (No Known Allergies)    Family History  Problem Relation Age of Onset   Eczema Sister    Colon polyps Neg Hx    Colon cancer Neg Hx     Social History   Socioeconomic History   Marital status: Legally Separated    Spouse name: Not on file   Number of children: Not on file   Years of education: Not on file   Highest education level: Not on file  Occupational History   Not on  file  Tobacco Use   Smoking status: Never   Smokeless tobacco: Never  Vaping Use   Vaping status: Every Day   Substances: Nicotine   Substance and Sexual Activity   Alcohol use: Yes    Comment: couple times per week   Drug use: Yes    Types: Marijuana    Comment: none at the moment 03/03/2023   Sexual activity: Yes    Birth control/protection: Condom  Other Topics Concern   Not on file  Social History Narrative   Not on file   Social Drivers of Health   Financial Resource Strain: Not on file  Food Insecurity: Not on file  Transportation Needs: Not on file  Physical Activity: Not on file  Stress: Not on file  Social Connections: Not on file  Intimate Partner Violence: Not on file     Review of Systems   Gen: Denies any  fever, chills, fatigue, weight loss, lack of appetite.  CV: Denies chest pain, heart palpitations, peripheral edema, syncope.  Resp: Denies shortness of breath at rest or with exertion. Denies wheezing or cough.  GI: Denies dysphagia or odynophagia. Denies jaundice, hematemesis, fecal incontinence. GU : Denies urinary burning, urinary frequency, urinary hesitancy MS: Denies joint pain, muscle weakness, cramps, or limitation of movement.  Derm: Denies rash, itching, dry skin Psych: Denies depression, anxiety, memory loss, and confusion Heme: Denies bruising, bleeding, and enlarged lymph nodes.   Physical Exam   BP 113/68   Pulse 68   Temp 98.2 F (36.8 C)   Ht 6' (1.829 m)   Wt 177 lb 14.4 oz (80.7 kg)   BMI 24.13 kg/m  General:   Alert and oriented. Pleasant and cooperative. Well-nourished and well-developed.  Head:  Normocephalic and atraumatic. Eyes:  Without icterus Abdomen:  +BS, soft, non-tender and non-distended. No HSM noted. No guarding or rebound. No masses appreciated.  Rectal:  Deferred  Msk:  Symmetrical without gross deformities. Normal posture. Extremities:  Without edema. Neurologic:  Alert and  oriented x4;  grossly normal neurologically. Skin:  Intact without significant lesions or rashes. Psych:  Alert and cooperative. Normal mood and affect.   Assessment   Keith Parks is a 28 y.o. male presenting today with a history of GERD, constipation now resolved, abdominal pain, presenting with persistent dyspepsia.  Dyspepsia: for several years and noting increased belching, regurgitating, and coffee grounds with regurgitating but no melena. Despite recommendations to take PPI daily has been taking every few days but notes GERD is somewhat better. Historically with RUQ discomfort but now LUQ discomfort.  NSAID exposure recently. Suspect atypical GERD, gastritis, possible NSAID-induced insult. As last EGD in 2021 and records not available, will arrange diagnostic  EGD.   PLAN    Take omeprazole  40 mg daily, 30 minutes before breakfast Proceed with upper endoscopy by Dr. Mordechai April in near future: the risks, benefits, and alternatives have been discussed with the patient in detail. The patient states understanding and desires to proceed.  Avoid NSAIDs 3 month return   Delman Ferns, PhD, [facility]

## 2024-05-18 ENCOUNTER — Encounter (HOSPITAL_COMMUNITY)
Admission: RE | Disposition: A | Discharge status: Home / Self Care | Payer: Self-pay | Source: Home / Self Care | Attending: Internal Medicine

## 2024-05-18 ENCOUNTER — Ambulatory Visit (HOSPITAL_COMMUNITY)

## 2024-05-18 ENCOUNTER — Ambulatory Visit (HOSPITAL_COMMUNITY)
Admission: RE | Admit: 2024-05-18 | Discharge: 2024-05-18 | Disposition: A | Discharge status: Home / Self Care | Attending: Internal Medicine | Admitting: Internal Medicine

## 2024-05-18 ENCOUNTER — Encounter (HOSPITAL_COMMUNITY): Payer: Self-pay | Admitting: Internal Medicine

## 2024-05-18 ENCOUNTER — Other Ambulatory Visit: Payer: Self-pay

## 2024-05-18 DIAGNOSIS — K5900 Constipation, unspecified: Secondary | ICD-10-CM | POA: Insufficient documentation

## 2024-05-18 DIAGNOSIS — F32A Depression, unspecified: Secondary | ICD-10-CM | POA: Insufficient documentation

## 2024-05-18 DIAGNOSIS — I10 Essential (primary) hypertension: Secondary | ICD-10-CM | POA: Insufficient documentation

## 2024-05-18 DIAGNOSIS — K2990 Gastroduodenitis, unspecified, without bleeding: Secondary | ICD-10-CM | POA: Diagnosis not present

## 2024-05-18 DIAGNOSIS — T182XXA Foreign body in stomach, initial encounter: Secondary | ICD-10-CM | POA: Diagnosis not present

## 2024-05-18 DIAGNOSIS — K219 Gastro-esophageal reflux disease without esophagitis: Secondary | ICD-10-CM | POA: Insufficient documentation

## 2024-05-18 DIAGNOSIS — K30 Functional dyspepsia: Secondary | ICD-10-CM | POA: Diagnosis present

## 2024-05-18 DIAGNOSIS — F17290 Nicotine dependence, other tobacco product, uncomplicated: Secondary | ICD-10-CM | POA: Diagnosis not present

## 2024-05-18 DIAGNOSIS — K2970 Gastritis, unspecified, without bleeding: Secondary | ICD-10-CM

## 2024-05-18 DIAGNOSIS — K2980 Duodenitis without bleeding: Secondary | ICD-10-CM

## 2024-05-18 DIAGNOSIS — K3189 Other diseases of stomach and duodenum: Secondary | ICD-10-CM | POA: Diagnosis not present

## 2024-05-18 DIAGNOSIS — F419 Anxiety disorder, unspecified: Secondary | ICD-10-CM | POA: Diagnosis not present

## 2024-05-18 DIAGNOSIS — Z79899 Other long term (current) drug therapy: Secondary | ICD-10-CM | POA: Insufficient documentation

## 2024-05-18 HISTORY — PX: ESOPHAGOGASTRODUODENOSCOPY: SHX5428

## 2024-05-18 SURGERY — EGD (ESOPHAGOGASTRODUODENOSCOPY)
Anesthesia: General

## 2024-05-18 MED ORDER — LIDOCAINE 2% (20 MG/ML) 5 ML SYRINGE
INTRAMUSCULAR | Status: DC | PRN
  Administered 2024-05-18: 60 mg via INTRAVENOUS

## 2024-05-18 MED ORDER — DEXMEDETOMIDINE HCL IN NACL 80 MCG/20ML IV SOLN
INTRAVENOUS | Status: DC | PRN
  Administered 2024-05-18: 6 ug via INTRAVENOUS

## 2024-05-18 MED ORDER — LACTATED RINGERS IV SOLN: INTRAVENOUS | Status: DC

## 2024-05-18 MED ORDER — PROPOFOL 10 MG/ML IV BOLUS
INTRAVENOUS | Status: DC | PRN
Start: 2024-05-18 — End: 2024-05-18
  Administered 2024-05-18: 200 ug/kg/min via INTRAVENOUS
  Administered 2024-05-18: 50 mg via INTRAVENOUS
  Administered 2024-05-18: 80 mg via INTRAVENOUS
  Administered 2024-05-18: 30 mg via INTRAVENOUS

## 2024-05-18 NOTE — Op Note (Signed)
 [facility] Patient Name: Keith Parks Procedure Date: 05/18/2024 10:39 AM MRN: [id number] Date of Birth: 12-30-1995 Attending MD: Keith Parks. Keith Parks , [place], [id number] CSN: [id number] Age: 28 Admit Type: Outpatient Procedure:                Upper GI endoscopy Indications:              Functional Dyspepsia Providers:                Keith Parks. Cindie, DO, Keith Parks. Museum/gallery exhibitions officer, RN,                            [employer], RN, Keith Parks, Technician Referring MD:              Medicines:                See the Anesthesia note for documentation of the                            administered medications Complications:            No immediate complications. Estimated Blood Loss:     Estimated blood loss was minimal. Procedure:                Pre-Anesthesia Assessment:                           - The anesthesia plan was to use monitored                            anesthesia care (MAC).                           After obtaining informed consent, the endoscope was                            passed under direct vision. Throughout the                            procedure, the patient's blood pressure, pulse, and                            oxygen saturations were monitored continuously. The                            GIF-H190 ([id number]) scope was introduced through the                            mouth, and advanced to the second part of duodenum.                            The upper GI endoscopy was accomplished without                            difficulty. The patient tolerated the procedure  well. Scope In: 10:55:11 AM Scope Out: 10:58:36 AM Total Procedure Duration: 0 hours 3 minutes 25 seconds  Findings:      The Z-line was regular and was found 40 cm from the incisors.      A medium amount of food (residue) was found in the gastric body.      Patchy mild inflammation characterized by erythema was found in the       gastric body and in the gastric  antrum. Biopsies were taken with a cold       forceps for Helicobacter pylori testing.      Patchy moderate inflammation characterized by erosions and erythema was       found in the duodenal bulb, in the first portion of the duodenum and in       the second portion of the duodenum. Biopsies were taken with a cold       forceps for histology. Impression:               - Z-line regular, 40 cm from the incisors.                           - A medium amount of food (residue) in the stomach.                           - Gastritis. Biopsied.                           - Duodenitis. Biopsied. Moderate Sedation:      Per Anesthesia Care Recommendation:           - Patient has a contact number available for                            emergencies. The signs and symptoms of potential                            delayed complications were discussed with the                            patient. Return to normal activities tomorrow.                            Written discharge instructions were provided to the                            patient.                           - Resume previous diet.                           - Continue present medications.                           - Await pathology results.                           - Use a proton pump inhibitor PO daily.                           -  Food in stomach raises suspicion of delayed                            gastric emptying. Consider GES                           - Return to GI clinic in 8 weeks. Procedure Code(s):        --- Professional ---                           [phone], Esophagogastroduodenoscopy, flexible,                            transoral; with biopsy, single or multiple Diagnosis Code(s):        --- Professional ---                           K29.70, Gastritis, unspecified, without bleeding                           K29.80, Duodenitis without bleeding                           K30, Functional dyspepsia CPT copyright 2022 American  Medical Association. All rights reserved. The codes documented in this report are preliminary and upon coder review may  be revised to meet current compliance requirements. Keith Parks. Cindie, DO Keith Parks. Keith Brasington, DO 05/18/2024 11:01:50 AM This report has been signed electronically. Number of Addenda: 0

## 2024-05-18 NOTE — Interval H&P Note (Signed)
 History and Physical Interval Note:  05/18/2024 10:41 AM  Keith Parks  has presented today for surgery, with the diagnosis of DYSPEPSIA.  The various methods of treatment have been discussed with the patient and family. After consideration of risks, benefits and other options for treatment, the patient has consented to  Procedure(s) with comments: EGD (ESOPHAGOGASTRODUODENOSCOPY) (N/A) - 11:15am, asa 2 as a surgical intervention.  The patient's history has been reviewed, patient examined, no change in status, stable for surgery.  I have reviewed the patient's chart and labs.  Questions were answered to the patient's satisfaction.     Carlin MARLA Hasty

## 2024-05-18 NOTE — Anesthesia Procedure Notes (Signed)
 Date/Time: 05/18/2024 10:51 AM  Performed by: Para Jerelene CROME, CRNAOxygen Delivery Method: Nasal cannula Comments: OptiFlow Nasal Cannula.

## 2024-05-18 NOTE — Anesthesia Postprocedure Evaluation (Signed)
 Anesthesia Post Note  Patient: Riel A Eichenberger  Procedure(s) Performed: EGD (ESOPHAGOGASTRODUODENOSCOPY)  Patient location during evaluation: PACU Anesthesia Type: General Level of consciousness: awake and alert Pain management: pain level controlled Vital Signs Assessment: post-procedure vital signs reviewed and stable Respiratory status: spontaneous breathing, nonlabored ventilation, respiratory function stable and patient connected to nasal cannula oxygen Cardiovascular status: stable and blood pressure returned to baseline Postop Assessment: no apparent nausea or vomiting Anesthetic complications: no  No notable events documented.   Last Vitals:  Vitals:   05/18/24 1107 05/18/24 1114  BP: (!) 95/51 (!) 93/56  Pulse: (!) 44 (!) 38  Resp: 14 13  Temp:    SpO2: 97% 97%    Last Pain:  Vitals:   05/18/24 1116  TempSrc:   PainSc: 0-No pain                 Andrea Limes

## 2024-05-18 NOTE — Transfer of Care (Signed)
 Immediate Anesthesia Transfer of Care Note  Patient: Keith Parks  Procedure(s) Performed: EGD (ESOPHAGOGASTRODUODENOSCOPY)  Patient Location: Endoscopy Unit  Anesthesia Type:General  Level of Consciousness: drowsy and patient cooperative  Airway & Oxygen Therapy: Patient Spontanous Breathing  Post-op Assessment: Report given to RN and Post -op Vital signs reviewed and stable  Post vital signs: Reviewed and stable  Last Vitals:  Vitals Value Taken Time  BP 97/54 05/18/24 11:02  Temp 36.4 C 05/18/24 11:02  Pulse 47 05/18/24 11:02  Resp 15 05/18/24 11:02  SpO2 98 % 05/18/24 11:02    Last Pain:  Vitals:   05/18/24 1102  TempSrc: Oral  PainSc:       Patients Stated Pain Goal: 4 (05/18/24 1008)  Complications: No notable events documented.

## 2024-05-18 NOTE — Discharge Instructions (Addendum)
 EGD Discharge instructions Please read the instructions outlined below and refer to this sheet in the next few weeks. These discharge instructions provide you with general information on caring for yourself after you leave the hospital. Your doctor may also give you specific instructions. While your treatment has been planned according to the most current medical practices available, unavoidable complications occasionally occur. If you have any problems or questions after discharge, please call your doctor. ACTIVITY You may resume your regular activity but move at a slower pace for the next 24 hours.  Take frequent rest periods for the next 24 hours.  Walking will help expel (get rid of) the air and reduce the bloated feeling in your abdomen.  No driving for 24 hours (because of the anesthesia (medicine) used during the test).  You may shower.  Do not sign any important legal documents or operate any machinery for 24 hours (because of the anesthesia used during the test).  NUTRITION Drink plenty of fluids.  You may resume your normal diet.  Begin with a light meal and progress to your normal diet.  Avoid alcoholic beverages for 24 hours or as instructed by your caregiver.  MEDICATIONS You may resume your normal medications unless your caregiver tells you otherwise.  WHAT YOU CAN EXPECT TODAY You may experience abdominal discomfort such as a feeling of fullness or "gas" pains.  FOLLOW-UP Your doctor will discuss the results of your test with you.  SEEK IMMEDIATE MEDICAL ATTENTION IF ANY OF THE FOLLOWING OCCUR: Excessive nausea (feeling sick to your stomach) and/or vomiting.  Severe abdominal pain and distention (swelling).  Trouble swallowing.  Temperature over 101 F (37.8 C).  Rectal bleeding or vomiting of blood.   Your EGD revealed mild amount inflammation in your stomach and small bowel.  I took biopsies of this to rule out infection with a bacteria called H. pylori.  Await pathology  results, my office will contact you.  Esophagus appeared normal.  You did have evidence of food residue in your stomach.  This raises the question of possible delayed gastric emptying.  Continue on omeprazole  daily.  Follow-up in GI office in 6 to 8 weeks. Message sent to the office and will schedule this appointment.     I hope you have a great rest of your week!  [name], D.O. Gastroenterology and Hepatology [facility]

## 2024-05-18 NOTE — Anesthesia Preprocedure Evaluation (Addendum)
 Anesthesia Evaluation  Patient identified by MRN, date of birth, ID band Patient awake  General Assessment Comment:Pt someone sedated Took Klonopin this am   Reviewed: Allergy  & Precautions, H&P , NPO status , Patient's Chart, lab work & pertinent test results  Airway Mallampati: II  TM Distance: >3 FB Neck ROM: Full    Dental no notable dental hx.    Pulmonary Current Smoker Vapes tobacco and marijuana Vaped tobacco this am Lungs clear   Pulmonary exam normal breath sounds clear to auscultation       Cardiovascular hypertension, Normal cardiovascular exam Rhythm:Regular Rate:Normal     Neuro/Psych  PSYCHIATRIC DISORDERS Anxiety Depression    negative neurological ROS     GI/Hepatic Neg liver ROS,GERD  ,,  Endo/Other  negative endocrine ROS    Renal/GU negative Renal ROS  negative genitourinary   Musculoskeletal negative musculoskeletal ROS (+)    Abdominal   Peds negative pediatric ROS (+)  Hematology negative hematology ROS (+)   Anesthesia Other Findings   Reproductive/Obstetrics negative OB ROS                             Anesthesia Physical Anesthesia Plan  ASA: 2  Anesthesia Plan: General   Post-op Pain Management:    Induction: Intravenous  PONV Risk Score and Plan: Propofol infusion  Airway Management Planned: Nasal Cannula  Additional Equipment:   Intra-op Plan:   Post-operative Plan:   Informed Consent: I have reviewed the patients History and Physical, chart, labs and discussed the procedure including the risks, benefits and alternatives for the proposed anesthesia with the patient or authorized representative who has indicated his/her understanding and acceptance.     Dental advisory given  Plan Discussed with: CRNA  Anesthesia Plan Comments:        Anesthesia Quick Evaluation

## 2024-05-19 ENCOUNTER — Encounter (HOSPITAL_COMMUNITY): Payer: Self-pay | Admitting: Internal Medicine

## 2024-05-19 LAB — SURGICAL PATHOLOGY

## 2024-06-01 ENCOUNTER — Ambulatory Visit: Payer: Self-pay | Admitting: Internal Medicine

## 2024-07-13 ENCOUNTER — Ambulatory Visit: Admitting: Gastroenterology
